# Patient Record
Sex: Male | Born: 1955
Health system: Southern US, Community
[De-identification: ages and names within clinical notes are randomized; demographics above are authoritative.]

## PROBLEM LIST (undated history)

## (undated) DIAGNOSIS — T7840XA Allergy, unspecified, initial encounter: Secondary | ICD-10-CM

## (undated) DIAGNOSIS — C801 Malignant (primary) neoplasm, unspecified: Secondary | ICD-10-CM

## (undated) DIAGNOSIS — E785 Hyperlipidemia, unspecified: Secondary | ICD-10-CM

## (undated) DIAGNOSIS — K219 Gastro-esophageal reflux disease without esophagitis: Secondary | ICD-10-CM

## (undated) DIAGNOSIS — D126 Benign neoplasm of colon, unspecified: Secondary | ICD-10-CM

## (undated) DIAGNOSIS — E559 Vitamin D deficiency, unspecified: Secondary | ICD-10-CM

## (undated) DIAGNOSIS — K648 Other hemorrhoids: Secondary | ICD-10-CM

## (undated) DIAGNOSIS — K5730 Diverticulosis of large intestine without perforation or abscess without bleeding: Secondary | ICD-10-CM

## (undated) DIAGNOSIS — I10 Essential (primary) hypertension: Secondary | ICD-10-CM

## (undated) DIAGNOSIS — H9190 Unspecified hearing loss, unspecified ear: Secondary | ICD-10-CM

## (undated) DIAGNOSIS — R011 Cardiac murmur, unspecified: Secondary | ICD-10-CM

## (undated) HISTORY — PX: OTHER SURGICAL HISTORY: SHX169

## (undated) HISTORY — DX: Unspecified hearing loss, unspecified ear: H91.90

## (undated) HISTORY — DX: Other hemorrhoids: K64.8

## (undated) HISTORY — DX: Gastro-esophageal reflux disease without esophagitis: K21.9

## (undated) HISTORY — DX: Diverticulosis of large intestine without perforation or abscess without bleeding: K57.30

## (undated) HISTORY — DX: Malignant (primary) neoplasm, unspecified: C80.1

## (undated) HISTORY — DX: Hyperlipidemia, unspecified: E78.5

## (undated) HISTORY — DX: Allergy, unspecified, initial encounter: T78.40XA

## (undated) HISTORY — PX: POLYPECTOMY: SHX149

## (undated) HISTORY — DX: Essential (primary) hypertension: I10

## (undated) HISTORY — DX: Cardiac murmur, unspecified: R01.1

## (undated) HISTORY — DX: Benign neoplasm of colon, unspecified: D12.6

## (undated) HISTORY — DX: Vitamin D deficiency, unspecified: E55.9

## (undated) HISTORY — PX: COLONOSCOPY: SHX174

---

## 2002-08-14 ENCOUNTER — Encounter (INDEPENDENT_AMBULATORY_CARE_PROVIDER_SITE_OTHER): Payer: Self-pay | Admitting: Specialist

## 2002-08-14 ENCOUNTER — Ambulatory Visit (HOSPITAL_BASED_OUTPATIENT_CLINIC_OR_DEPARTMENT_OTHER): Admission: RE | Admit: 2002-08-14 | Discharge: 2002-08-14 | Payer: Self-pay | Admitting: Surgery

## 2005-06-12 ENCOUNTER — Ambulatory Visit: Payer: Self-pay | Admitting: Internal Medicine

## 2005-06-26 ENCOUNTER — Ambulatory Visit: Payer: Self-pay | Admitting: Internal Medicine

## 2007-09-20 ENCOUNTER — Emergency Department (HOSPITAL_COMMUNITY): Admission: EM | Admit: 2007-09-20 | Discharge: 2007-09-20 | Payer: Self-pay | Admitting: Emergency Medicine

## 2008-09-11 ENCOUNTER — Encounter: Payer: Self-pay | Admitting: Internal Medicine

## 2008-10-20 ENCOUNTER — Ambulatory Visit: Payer: Self-pay | Admitting: Internal Medicine

## 2008-10-20 DIAGNOSIS — R195 Other fecal abnormalities: Secondary | ICD-10-CM | POA: Insufficient documentation

## 2008-11-12 ENCOUNTER — Encounter: Payer: Self-pay | Admitting: Internal Medicine

## 2008-11-12 ENCOUNTER — Ambulatory Visit: Payer: Self-pay | Admitting: Internal Medicine

## 2008-11-12 DIAGNOSIS — K5730 Diverticulosis of large intestine without perforation or abscess without bleeding: Secondary | ICD-10-CM

## 2008-11-12 DIAGNOSIS — D126 Benign neoplasm of colon, unspecified: Secondary | ICD-10-CM

## 2008-11-12 DIAGNOSIS — K648 Other hemorrhoids: Secondary | ICD-10-CM

## 2008-11-12 HISTORY — DX: Benign neoplasm of colon, unspecified: D12.6

## 2008-11-12 HISTORY — DX: Other hemorrhoids: K64.8

## 2008-11-12 HISTORY — DX: Diverticulosis of large intestine without perforation or abscess without bleeding: K57.30

## 2008-11-22 ENCOUNTER — Encounter: Payer: Self-pay | Admitting: Internal Medicine

## 2010-07-19 DIAGNOSIS — E785 Hyperlipidemia, unspecified: Secondary | ICD-10-CM

## 2010-07-19 DIAGNOSIS — I10 Essential (primary) hypertension: Secondary | ICD-10-CM | POA: Insufficient documentation

## 2010-09-09 NOTE — Op Note (Signed)
NAME:  Cesar West, Cesar West                            ACCOUNT NO.:  [id number]   MEDICAL RECORD NO.:  [id number]                   PATIENT TYPE:  AMB   LOCATION:  DSC                                  FACILITY:  [facility]   PHYSICIAN:  Abigail Miyamoto, M.D.              DATE OF BIRTH:  10/18/1955   DATE OF PROCEDURE:  08/14/2002  DATE OF DISCHARGE:                                 OPERATIVE REPORT   PREOPERATIVE DIAGNOSES:  1. Right axillary mass.  2. Left flank mass x 2.   DESCRIPTION OF PROCEDURE:  1. Excision of right axillary mass.  2. Excision of left flank mass x 2.   SURGEON:  Abigail Miyamoto, M.D.   ANESTHESIA:  1% Lidocaine and monitored anesthesia care.   ESTIMATED BLOOD LOSS:  Minimal.   FINDINGS:  The patient was found to have a 5 cm right flank mass.  It  appeared to be consistent with a sebaceous cyst.  Both left flank masses  appeared to be consistent with lipomas.   DESCRIPTION OF PROCEDURE:  The patient was brought to the operating room and  identified.  He was placed supine on the operating room table and anesthesia  was induced.  His axilla was then prepped and draped as well as his left  flank in the usual sterile manner.  The skin overlying the large right  axillary mass was then anesthetized with 1% Lidocaine.  Incision was made in  the skin and taken down through mass which appeared to be consistent with  sebaceous cyst.  The sebaceous material had to be expressed from the cyst in  order to get it out through the small incision.  The entire cyst capsule was  then identified and excised with the electrocautery.  The wound was then  irrigated with normal saline.  The skin was then closed with interrupted 3-0  Vicryl sutures and Steri-Strips.  Next, the skin over both left flank masses  were anesthetized with 1% Lidocaine.  Incisions were made with the scalpel  and taken down to the masses which appeared consistent with well formed  lipomas.  Each was then  completely excised with electrocautery.  Again these  wounds were irrigated and then closed with 3-0 Vicryl subcuticular sutures  and Steri-Strips.  Gauze and tape were then placed over all incisions.  The  patient tolerated the procedure well. All sponge, needle and instrument  counts were correct at the end of the procedure.  The patient was then taken  from the operating room to the recovery room.  The left flank masses were  sent to pathology for identification.                                               Abigail Miyamoto, M.D.  DB/MEDQ  D:  08/14/2002  T:  08/14/2002  Job:  [id number]

## 2010-10-27 ENCOUNTER — Ambulatory Visit
Admission: RE | Admit: 2010-10-27 | Discharge: 2010-10-27 | Disposition: A | Discharge status: Home / Self Care | Payer: Self-pay | Source: Ambulatory Visit | Attending: *Deleted | Admitting: *Deleted

## 2010-10-27 ENCOUNTER — Other Ambulatory Visit: Payer: Self-pay | Admitting: *Deleted

## 2010-10-27 ENCOUNTER — Other Ambulatory Visit: Payer: Self-pay | Admitting: Family Medicine

## 2010-10-27 DIAGNOSIS — T1490XA Injury, unspecified, initial encounter: Secondary | ICD-10-CM

## 2012-07-03 ENCOUNTER — Encounter: Payer: Self-pay | Admitting: Family Medicine

## 2012-07-03 DIAGNOSIS — Z860100 Personal history of colon polyps, unspecified: Secondary | ICD-10-CM | POA: Insufficient documentation

## 2012-07-03 DIAGNOSIS — H9193 Unspecified hearing loss, bilateral: Secondary | ICD-10-CM

## 2012-07-03 DIAGNOSIS — E559 Vitamin D deficiency, unspecified: Secondary | ICD-10-CM

## 2012-07-03 DIAGNOSIS — H9190 Unspecified hearing loss, unspecified ear: Secondary | ICD-10-CM | POA: Insufficient documentation

## 2012-07-12 ENCOUNTER — Encounter: Payer: Self-pay | Admitting: Family Medicine

## 2012-07-12 ENCOUNTER — Ambulatory Visit (INDEPENDENT_AMBULATORY_CARE_PROVIDER_SITE_OTHER): Payer: 59 | Admitting: Family Medicine

## 2012-07-12 VITALS — BP 140/83 | HR 66 | Temp 98.4°F | Ht 68.0 in | Wt 168.6 lb

## 2012-07-12 DIAGNOSIS — H9190 Unspecified hearing loss, unspecified ear: Secondary | ICD-10-CM

## 2012-07-12 DIAGNOSIS — I10 Essential (primary) hypertension: Secondary | ICD-10-CM

## 2012-07-12 DIAGNOSIS — E559 Vitamin D deficiency, unspecified: Secondary | ICD-10-CM

## 2012-07-12 DIAGNOSIS — E785 Hyperlipidemia, unspecified: Secondary | ICD-10-CM

## 2012-07-12 LAB — COMPLETE METABOLIC PANEL WITH GFR
ALT: 27 U/L (ref 0–53)
AST: 19 U/L (ref 0–37)
Albumin: 4.4 g/dL (ref 3.5–5.2)
Alkaline Phosphatase: 73 U/L (ref 39–117)
BUN: 11 mg/dL (ref 6–23)
CO2: 28 mEq/L (ref 19–32)
Calcium: 9.1 mg/dL (ref 8.4–10.5)
Chloride: 104 mEq/L (ref 96–112)
Creat: 0.93 mg/dL (ref 0.50–1.35)
GFR, Est African American: >89 mL/min
GFR, Est Non African American: >89 mL/min
Glucose, Bld: 106 mg/dL — ABNORMAL HIGH (ref 70–99)
Potassium: 4.4 mEq/L (ref 3.5–5.3)
Sodium: 138 mEq/L (ref 135–145)
Total Bilirubin: 1.5 mg/dL — ABNORMAL HIGH (ref 0.3–1.2)
Total Protein: 7 g/dL (ref 6.0–8.3)

## 2012-07-12 MED ORDER — AMLODIPINE BESYLATE 5 MG PO TABS: 10.0000 mg | ORAL_TABLET | Freq: Every day | ORAL | Status: DC

## 2012-07-12 MED ORDER — LOSARTAN POTASSIUM 50 MG PO TABS: 50.0000 mg | ORAL_TABLET | Freq: Every day | ORAL | Status: DC

## 2012-07-12 NOTE — Progress Notes (Signed)
Subjective:     Patient ID: Cesar West, male   DOB: 18-Aug-1955, 57 y.o.   MRN: [id number]  HPI  Cesar West is here for followup of his medical problems:  1 hypertension: No headache chest pain palpitations or pedal edema he is doing quite well with present regimen which includes Norvasc and losartan. 2 hyperlipidemia: He stopped his statin he felt too tired. He does not want to go back on medication. 3 vitamin D deficiency he supposed to be taking vitamin D replacement therapy. Past Medical History  Diagnosis Date  . Adenomatous colon polyp 7 /22/ 2010  . Hypertension   . Hyperlipidemia   . Hearing loss   . Vitamin D deficiency   . Diverticulosis of colon 11/12/2008    left colon  . Internal hemorrhoid 11/12/2008  . Cancer     basal cell skin of the right triceps area   History reviewed. No pertinent past surgical history. History   Social History  . Marital Status: Single    Spouse Name: N/A    Number of Children: N/A  . Years of Education: N/A   Occupational History  . Not on file.   Social History Main Topics  . Smoking status: Never Smoker   . Smokeless tobacco: Not on file  . Alcohol Use: No  . Drug Use: No  . Sexually Active: Not on file   Other Topics Concern  . Not on file   Social History Narrative  . No narrative on file   Family History  Problem Relation Age of Onset  . Hypertension Mother   . Hypertension Father   . Heart disease Father    Current Outpatient Prescriptions on File Prior to Visit  Medication Sig Dispense Refill  . cholecalciferol (VITAMIN D) 1000 UNITS tablet Take 2,000 Units by mouth daily.      . fish oil-omega-3 fatty acids 1000 MG capsule Take 2 g by mouth daily.      Marland Kitchen atorvastatin (LIPITOR) 40 MG tablet Take 40 mg by mouth daily.        . Ezetimibe-Atorvastatin (LIPTRUZET) 10-10 MG TABS Take 1 tablet by mouth daily.       No current facility-administered medications on file prior to visit.   No Known Allergies Immunization  History  Administered Date(s) Administered  . Influenza Whole 04/04/2012  . Td 04/09/2007, 03/06/2011   Prior to Admission medications   Medication Sig Start Date End Date Taking? Authorizing Provider  amLODipine (NORVASC) 5 MG tablet Take 2 tablets (10 mg total) by mouth daily. 07/12/12  Yes Ileana Ladd, MD  cholecalciferol (VITAMIN D) 1000 UNITS tablet Take 2,000 Units by mouth daily.   Yes Historical Provider, MD  fish oil-omega-3 fatty acids 1000 MG capsule Take 2 g by mouth daily.   Yes Historical Provider, MD  losartan (COZAAR) 50 MG tablet Take 1 tablet (50 mg total) by mouth daily. 07/12/12  Yes Ileana Ladd, MD  atorvastatin (LIPITOR) 40 MG tablet Take 40 mg by mouth daily.      Historical Provider, MD  Ezetimibe-Atorvastatin (LIPTRUZET) 10-10 MG TABS Take 1 tablet by mouth daily.    Historical Provider, MD    Review of Systems All of the systems are negative. His fatigue resolved when he stopped the statin.    Objective:   Physical Exam On examination he appeared in no acute distress. Vital signs as documented. BP 140/83  Pulse 66  Temp(Src) 98.4 F (36.9 C) (Oral)  Ht 5\' 8"  (1.727  m)  Wt 168 lb 9.6 oz (76.476 kg)  BMI 25.64 kg/m2  Skin warm and dry and without overt rashes.  Head &Neck without JVD. Normal. Bilateral hearing loss Lungs clear.  Heart exam notable for regular rhythm, normal sounds and absence of murmurs, rubs or gallops.  Abdomen unremarkable and without evidence of organomegaly, masses, or abdominal aortic enlargement. Mild central obesity Extremities nonedematous.    Assessment:     Unspecified vitamin D deficiency - Plan: Vitamin D 25 hydroxy  Hearing loss, unspecified laterality  HTN (hypertension) - Plan: COMPLETE METABOLIC PANEL WITH GFR  Hyperlipidemia - Plan: COMPLETE METABOLIC PANEL WITH GFR, NMR Lipoprofile with Lipids      We discussed about getting hearing aids he is reluctant to do that we discussed his risk for coronary event  with hyperlipidemia and his risk factors. Has tried a vegan diet diet not 100%. He is doing his best to make lifestyle therapeutic changes quite a challenge for him. He is reluctant to be on medicines.  Plan:     Orders Placed This Encounter  Procedures  . COMPLETE METABOLIC PANEL WITH GFR  . NMR Lipoprofile with Lipids  . Vitamin D 25 hydroxy   Diet and Exercise discussed with patient. For nutrition information, I recommended books: Eat to Live by Dr Monico Hoar. Prevent and Reverse Heart Disease by Dr Suzzette Righter.  Exercise recommendations are:  If unable to walk, then the patient can exercise in a chair 3 times a day. By flapping arms like a bird gently and raising legs outwards to the front.  If ambulatory, the patient can go for walks for 30 minutes 3 times a week. Then increase the intensity and duration as tolerated. Goal is to try to attain exercise frequency to 5 times a week. Best to perform resistance exercises 2 days a week and cardio type exercises 3 days per week.  Discussed at length with him the need for a statin to reduce his cardiovascular event risk. He will reconsider this based on the lab tests drawn today. Discussed LTC lifestyle therapeutic changes.  Terrisa Curfman P. Modesto Charon, M.D.

## 2012-07-13 LAB — VITAMIN D 25 HYDROXY (VIT D DEFICIENCY, FRACTURES): Vit D, 25-Hydroxy: 50 ng/mL (ref 30–89)

## 2012-07-17 LAB — NMR LIPOPROFILE WITH LIPIDS
Cholesterol, Total: 172 mg/dL (ref <200)
HDL Particle Number: 27.4 umol/L — ABNORMAL LOW (ref >=30.5)
HDL Size: 8.4 nm — ABNORMAL LOW (ref >=9.2)
HDL-C: 38 mg/dL — ABNORMAL LOW (ref >=40)
LDL (calc): 118 mg/dL — ABNORMAL HIGH (ref <100)
LDL Particle Number: 1389 nmol/L — ABNORMAL HIGH (ref <1000)
LDL Size: 20.8 nm (ref >20.5)
LP-IR Score: 43 (ref <=45)
Large HDL-P: 1.4 umol/L — ABNORMAL LOW (ref >=4.8)
Large VLDL-P: <0.8 nmol/L (ref <=2.7)
Small LDL Particle Number: 331 nmol/L (ref <=527)
Triglycerides: 80 mg/dL (ref <150)
VLDL Size: 40.7 nm (ref >=46.6)

## 2012-07-20 NOTE — Progress Notes (Signed)
Quick Note:  Labs abnormal. The LDLp is high and the LDLc is a little high. He really should be on some statin.I think he should try some Coenzyme Q10 daily to offset any fatigue with a Statin and start on Pravastatin 20 mg daily #30 RF x 3. If he okays then call in Rx. Thanks. ______

## 2012-07-22 ENCOUNTER — Telehealth: Payer: Self-pay | Admitting: Family Medicine

## 2012-07-22 NOTE — Telephone Encounter (Signed)
Notified pt of lab results 

## 2012-08-02 ENCOUNTER — Encounter: Payer: Self-pay | Admitting: Family Medicine

## 2013-01-05 ENCOUNTER — Other Ambulatory Visit: Payer: Self-pay | Admitting: Family Medicine

## 2013-02-03 ENCOUNTER — Other Ambulatory Visit: Payer: Self-pay | Admitting: Family Medicine

## 2013-03-28 ENCOUNTER — Ambulatory Visit (INDEPENDENT_AMBULATORY_CARE_PROVIDER_SITE_OTHER): Payer: Managed Care, Other (non HMO) | Admitting: Family Medicine

## 2013-03-28 ENCOUNTER — Encounter: Payer: Self-pay | Admitting: Family Medicine

## 2013-03-28 VITALS — BP 148/81 | HR 56 | Temp 98.0°F | Ht 68.0 in | Wt 159.2 lb

## 2013-03-28 DIAGNOSIS — Z860100 Personal history of colon polyps, unspecified: Secondary | ICD-10-CM

## 2013-03-28 DIAGNOSIS — Z119 Encounter for screening for infectious and parasitic diseases, unspecified: Secondary | ICD-10-CM

## 2013-03-28 DIAGNOSIS — E559 Vitamin D deficiency, unspecified: Secondary | ICD-10-CM

## 2013-03-28 DIAGNOSIS — I10 Essential (primary) hypertension: Secondary | ICD-10-CM

## 2013-03-28 DIAGNOSIS — H9190 Unspecified hearing loss, unspecified ear: Secondary | ICD-10-CM

## 2013-03-28 DIAGNOSIS — E785 Hyperlipidemia, unspecified: Secondary | ICD-10-CM

## 2013-03-28 DIAGNOSIS — Z0000 Encounter for general adult medical examination without abnormal findings: Secondary | ICD-10-CM

## 2013-03-28 DIAGNOSIS — Z125 Encounter for screening for malignant neoplasm of prostate: Secondary | ICD-10-CM

## 2013-03-28 MED ORDER — AMLODIPINE BESYLATE 10 MG PO TABS
10.0000 mg | ORAL_TABLET | Freq: Every day | ORAL | Status: DC
Start: 2013-03-28 — End: 2014-03-30

## 2013-03-28 MED ORDER — LOSARTAN POTASSIUM 100 MG PO TABS: ORAL_TABLET | ORAL | Status: DC

## 2013-03-28 NOTE — Patient Instructions (Addendum)
HEALTH MAINTENANCE Immunizations: Tetanus-Diphtheria Booster [phone] Pertusis Booster [phone] Flu Shot Due: every Fall Pneumonia Vaccine: usually at 59 years of age unless there are certain risk situations. Herpes Zoster/Shingles Vaccine due: usually at 59 years of age HPV AOZ:HYQM age 88 to 21 years in males and females.  Healthy Life Habits: Exercise Goal: 5-6 days/week; start gradually(ie 30 minutes/3days per week) Nutrition: Balanced healthy meals including Vegetables and Fruits. Consider  Reading the following books: 1) Eat to Live by Dr Ottis Stain; 2) Prevent and Reverse Heart Disease by Dr Suzzette Righter.  Vitamins:okay a multivitamin Aspirin:81 mg Stop Tobacco Use:n/a Seat Belt Use:+++ recommended Sunscreen Use:+++ recommended     Recommended Screening Tests: Colon Cancer Screening:due 2015 Blood work: today Cholesterol Screening: today           HIV:   n/a                Hepatitis C(people born 1945-1965):today   Monthly Self Testicular Exam:++++  Eye Exam: every 1 to 2 years recommended Dental Health: at least every 6 months  Others:    Living Will/Healthcare Power of Attorney: should have this in order with your personal estate planning

## 2013-03-28 NOTE — Progress Notes (Signed)
Patient ID: Xzavion Doswell, male   DOB: 1955-12-24, 57 y.o.   MRN: [id number] SUBJECTIVE: CC: Chief Complaint  Patient presents with  . Annual Exam    here for cpx    HPI:   Annual physical.  No complaints.   Patient is here for follow up of hyperlipidemia/HTN/vit d def: denies Headache;denies Chest Pain;denies weakness;denies Shortness of Breath and orthopnea;denies Visual changes;denies palpitations;denies cough;denies pedal edema;denies symptoms of TIA or stroke;deniesClaudication symptoms. admits to Compliance with medications; denies Problems with medications.   Plans to get hearing aids.  Past Medical History  Diagnosis Date  . Adenomatous colon polyp 7 /22/ 2010  . Hypertension   . Hyperlipidemia   . Hearing loss   . Vitamin D deficiency   . Diverticulosis of colon 11/12/2008    left colon  . Internal hemorrhoid 11/12/2008  . Cancer     basal cell skin of the right triceps area   No past surgical history on file. History   Social History  . Marital Status: Single    Spouse Name: N/A    Number of Children: N/A  . Years of Education: N/A   Occupational History  . Not on file.   Social History Main Topics  . Smoking status: Never Smoker   . Smokeless tobacco: Not on file  . Alcohol Use: No  . Drug Use: No  . Sexual Activity: Not on file   Other Topics Concern  . Not on file   Social History Narrative  . No narrative on file   Family History  Problem Relation Age of Onset  . Hypertension Mother   . Hypertension Father   . Heart disease Father    Current Outpatient Prescriptions on File Prior to Visit  Medication Sig Dispense Refill  . cholecalciferol (VITAMIN D) 1000 UNITS tablet Take 2,000 Units by mouth daily.      . fish oil-omega-3 fatty acids 1000 MG capsule Take 2 g by mouth daily.       No current facility-administered medications on file prior to visit.   No Known Allergies Immunization History  Administered Date(s) Administered  .  Influenza Split 01/16/2013  . Influenza Whole 04/04/2012  . Td 04/09/2007, 03/06/2011   Prior to Admission medications   Medication Sig Start Date End Date Taking? Authorizing Provider  amLODipine (NORVASC) 5 MG tablet TAKE 2 TABLETS (10 MG TOTAL) BY MOUTH DAILY. 02/03/13   Ileana Ladd, MD  atorvastatin (LIPITOR) 40 MG tablet Take 40 mg by mouth daily.      Historical Provider, MD  cholecalciferol (VITAMIN D) 1000 UNITS tablet Take 2,000 Units by mouth daily.    Historical Provider, MD  Ezetimibe-Atorvastatin (LIPTRUZET) 10-10 MG TABS Take 1 tablet by mouth daily.    Historical Provider, MD  fish oil-omega-3 fatty acids 1000 MG capsule Take 2 g by mouth daily.    Historical Provider, MD  losartan (COZAAR) 50 MG tablet TAKE 1 TABLET (50 MG TOTAL) BY MOUTH DAILY. 02/03/13   Ileana Ladd, MD     ROS: As above in the HPI. All other systems are stable or negative.  OBJECTIVE: APPEARANCE:  Patient in no acute distress.The patient appeared well nourished and normally developed. Acyanotic. Waist: VITAL SIGNS:BP 148/81  Pulse 56  Temp(Src) 98 F (36.7 C) (Oral)  Ht 5\' 8"  (1.727 m)  Wt 159 lb 3.2 oz (72.213 kg)  BMI 24.21 kg/m2   SKIN: warm and  Dry without overt rashes, tattoos and scars  HEAD and Neck:  without JVD, Head and scalp: normal Eyes:No scleral icterus. Fundi normal, eye movements normal. Ears: Auricle normal, canal normal, Tympanic membranes normal, insufflation normal. Nose: normal Throat: normal Neck & thyroid: normal  CHEST & LUNGS: Chest wall: normal Lungs: Clear  CVS: Reveals the PMI to be normally located. Regular rhythm, First and Second Heart sounds are normal,  absence of murmurs, rubs or gallops. Peripheral vasculature: Radial pulses: normal Dorsal pedis pulses: normal Posterior pulses: normal  ABDOMEN:  Appearance: normal Benign, no organomegaly, no masses, no Abdominal Aortic enlargement. No Guarding , no rebound. No Bruits. Bowel sounds:  normal  RECTAL: N/A GU: N/A  EXTREMETIES: nonedematous.  MUSCULOSKELETAL:  Spine: normal Joints: intact  NEUROLOGIC: oriented to time,place and person; nonfocal. Strength is normal Sensory is normal Reflexes are normal Cranial Nerves are normal.  ASSESSMENT:  Annual physical exam - Plan: PSA, total and free, Hepatitis C antibody  Personal history of colonic polyps  Hearing loss, unspecified laterality  Unspecified vitamin D deficiency - Plan: Vit D  25 hydroxy (rtn osteoporosis monitoring)  HTN (hypertension) - Plan: CMP14+EGFR, losartan (COZAAR) 100 MG tablet, amLODipine (NORVASC) 10 MG tablet  Hyperlipidemia - Plan: CMP14+EGFR, NMR, lipoprofile  Screening examination for infectious disease - Plan: Hepatitis C antibody  Screening for prostate cancer - Plan: PSA, total and free  BP not at goal. Will adjust medications.  PLAN:      HEALTH MAINTENANCE Immunizations: Tetanus-Diphtheria Booster [phone] Pertusis Booster [phone] Flu Shot Due: every Fall Pneumonia Vaccine: usually at 59 years of age unless there are certain risk situations. Herpes Zoster/Shingles Vaccine due: usually at 59 years of age HPV XBJ:YNWG age 39 to 8 years in males and females.  Healthy Life Habits: Exercise Goal: 5-6 days/week; start gradually(ie 30 minutes/3days per week) Nutrition: Balanced healthy meals including Vegetables and Fruits. Consider  Reading the following books: 1) Eat to Live by Dr Ottis Stain; 2) Prevent and Reverse Heart Disease by Dr Suzzette Righter.  Vitamins:okay a multivitamin Aspirin:81 mg Stop Tobacco Use:n/a Seat Belt Use:+++ recommended Sunscreen Use:+++ recommended     Recommended Screening Tests: Colon Cancer Screening:due 2015 Blood work: today Cholesterol Screening: today           HIV:   n/a                Hepatitis C(people born 1945-1965):today   Monthly Self Testicular Exam:++++  Eye Exam: every 1 to 2 years recommended Dental Health: at  least every 6 months  Others:    Living Will/Healthcare Power of Attorney: should have this in order with your personal estate planning   Orders Placed This Encounter  Procedures  . CMP14+EGFR  . NMR, lipoprofile  . PSA, total and free  . Hepatitis C antibody  . Vit D  25 hydroxy (rtn osteoporosis monitoring)   Meds ordered this encounter  Medications  . losartan (COZAAR) 100 MG tablet    Sig: TAKE 1 TABLET (50 MG TOTAL) BY MOUTH DAILY.    Dispense:  30 tablet    Refill:  5  . amLODipine (NORVASC) 10 MG tablet    Sig: Take 1 tablet (10 mg total) by mouth daily.    Dispense:  30 tablet    Refill:  11   Medications Discontinued During This Encounter  Medication Reason  . atorvastatin (LIPITOR) 40 MG tablet Patient has not taken in last 30 days  . Ezetimibe-Atorvastatin (LIPTRUZET) 10-10 MG TABS Patient has not taken in last 30 days  . losartan (COZAAR) 50 MG  tablet Reorder  . amLODipine (NORVASC) 5 MG tablet Reorder   Return in about 3 months (around 06/26/2013) for recheck BP, Recheck medical problems.  Trevia Nop P. [place] Charon, M.D.

## 2013-03-29 LAB — CMP14+EGFR
ALT: 21 IU/L (ref 0–44)
AST: 19 IU/L (ref 0–40)
Albumin/Globulin Ratio: 1.7 (ref 1.1–2.5)
Albumin: 4.7 g/dL (ref 3.5–5.5)
Alkaline Phosphatase: 85 IU/L (ref 39–117)
BUN/Creatinine Ratio: 11 (ref 9–20)
BUN: 11 mg/dL (ref 6–24)
CO2: 29 mmol/L (ref 18–29)
Calcium: 10.1 mg/dL (ref 8.7–10.2)
Chloride: 100 mmol/L (ref 97–108)
Creatinine, Ser: 1.01 mg/dL (ref 0.76–1.27)
GFR calc Af Amer: 94 mL/min/{1.73_m2} (ref >59)
GFR calc non Af Amer: 81 mL/min/{1.73_m2} (ref >59)
Globulin, Total: 2.7 g/dL (ref 1.5–4.5)
Glucose: 99 mg/dL (ref 65–99)
Potassium: 4.7 mmol/L (ref 3.5–5.2)
Sodium: 142 mmol/L (ref 134–144)
Total Bilirubin: 1.6 mg/dL — ABNORMAL HIGH (ref 0.0–1.2)
Total Protein: 7.4 g/dL (ref 6.0–8.5)

## 2013-03-29 LAB — PSA, TOTAL AND FREE
PSA, Free Pct: 29.3 %
PSA, Free: 0.41 ng/mL
PSA: 1.4 ng/mL (ref 0.0–4.0)

## 2013-03-29 LAB — NMR, LIPOPROFILE
Cholesterol: 170 mg/dL (ref <200)
HDL Cholesterol by NMR: 48 mg/dL (ref >=40)
HDL Particle Number: 35 umol/L (ref >=30.5)
LDL Particle Number: 1473 nmol/L — ABNORMAL HIGH (ref <1000)
LDL Size: 20.6 nm (ref >20.5)
LDLC SERPL CALC-MCNC: 102 mg/dL — ABNORMAL HIGH (ref <100)
LP-IR Score: 48 — ABNORMAL HIGH (ref <=45)
Small LDL Particle Number: 803 nmol/L — ABNORMAL HIGH (ref <=527)
Triglycerides by NMR: 101 mg/dL (ref <150)

## 2013-03-29 LAB — HEPATITIS C ANTIBODY: Hep C Virus Ab: <0.1 s/co ratio (ref 0.0–0.9)

## 2013-03-29 LAB — VITAMIN D 25 HYDROXY (VIT D DEFICIENCY, FRACTURES): Vit D, 25-Hydroxy: 44.1 ng/mL (ref 30.0–100.0)

## 2013-06-02 ENCOUNTER — Encounter: Payer: Self-pay | Admitting: *Deleted

## 2013-06-26 ENCOUNTER — Ambulatory Visit: Payer: Managed Care, Other (non HMO) | Admitting: Family Medicine

## 2013-06-27 ENCOUNTER — Ambulatory Visit: Payer: Managed Care, Other (non HMO) | Admitting: Family Medicine

## 2013-07-11 ENCOUNTER — Encounter: Payer: Self-pay | Admitting: Family Medicine

## 2013-07-11 ENCOUNTER — Ambulatory Visit (INDEPENDENT_AMBULATORY_CARE_PROVIDER_SITE_OTHER): Payer: Managed Care, Other (non HMO) | Admitting: Family Medicine

## 2013-07-11 VITALS — BP 143/81 | HR 62 | Temp 97.9°F | Ht 68.0 in | Wt 165.4 lb

## 2013-07-11 DIAGNOSIS — E559 Vitamin D deficiency, unspecified: Secondary | ICD-10-CM

## 2013-07-11 DIAGNOSIS — I10 Essential (primary) hypertension: Secondary | ICD-10-CM

## 2013-07-11 DIAGNOSIS — Z860100 Personal history of colon polyps, unspecified: Secondary | ICD-10-CM

## 2013-07-11 DIAGNOSIS — H9190 Unspecified hearing loss, unspecified ear: Secondary | ICD-10-CM

## 2013-07-11 DIAGNOSIS — E785 Hyperlipidemia, unspecified: Secondary | ICD-10-CM

## 2013-07-11 MED ORDER — LOSARTAN POTASSIUM-HCTZ 100-25 MG PO TABS: 1.0000 | ORAL_TABLET | Freq: Every day | ORAL | Status: DC

## 2013-07-11 NOTE — Patient Instructions (Signed)
DASH Diet The DASH diet stands for "Dietary Approaches to Stop Hypertension." It is a healthy eating plan that has been shown to reduce high blood pressure (hypertension) in as little as 14 days, while also possibly providing other significant health benefits. These other health benefits include reducing the risk of breast cancer after menopause and reducing the risk of type 2 diabetes, heart disease, colon cancer, and stroke. Health benefits also include weight loss and slowing kidney failure in patients with chronic kidney disease.  DIET GUIDELINES  Limit salt (sodium). Your diet should contain less than 1500 mg of sodium daily.  Limit refined or processed carbohydrates. Your diet should include mostly whole grains. Desserts and added sugars should be used sparingly.  Include small amounts of heart-healthy fats. These types of fats include nuts, oils, and tub margarine. Limit saturated and trans fats. These fats have been shown to be harmful in the body. CHOOSING FOODS  The following food groups are based on a 2000 calorie diet. See your Registered Dietitian for individual calorie needs. Grains and Grain Products (6 to 8 servings daily)  Eat More Often: Whole-wheat bread, brown rice, whole-grain or wheat pasta, quinoa, popcorn without added fat or salt (air popped).  Eat Less Often: White bread, white pasta, white rice, cornbread. Vegetables (4 to 5 servings daily)  Eat More Often: Fresh, frozen, and canned vegetables. Vegetables may be raw, steamed, roasted, or grilled with a minimal amount of fat.  Eat Less Often/Avoid: Creamed or fried vegetables. Vegetables in a cheese sauce. Fruit (4 to 5 servings daily)  Eat More Often: All fresh, canned (in natural juice), or frozen fruits. Dried fruits without added sugar. One hundred percent fruit juice ( cup [237 mL] daily).  Eat Less Often: Dried fruits with added sugar. Canned fruit in light or heavy syrup. [employer], Fish, and Poultry (2  servings or less daily. One serving is 3 to 4 oz [85-114 g]).  Eat More Often: Ninety percent or leaner ground beef, tenderloin, sirloin. Round cuts of beef, chicken breast, [place] breast. All fish. Grill, bake, or broil your meat. Nothing should be fried.  Eat Less Often/Avoid: Fatty cuts of meat, [place], or chicken leg, thigh, or wing. Fried cuts of meat or fish. Dairy (2 to 3 servings)  Eat More Often: Low-fat or fat-free milk, low-fat plain or light yogurt, reduced-fat or part-skim cheese.  Eat Less Often/Avoid: Milk (whole, 2%).Whole milk yogurt. Full-fat cheeses. Nuts, Seeds, and Legumes (4 to 5 servings per week)  Eat More Often: All without added salt.  Eat Less Often/Avoid: Salted nuts and seeds, canned beans with added salt. Fats and Sweets (limited)  Eat More Often: Vegetable oils, tub margarines without trans fats, sugar-free gelatin. Mayonnaise and salad dressings.  Eat Less Often/Avoid: Coconut oils, palm oils, butter, stick margarine, cream, half and half, cookies, candy, pie. FOR MORE INFORMATION The Dash Diet Eating Plan: www.dashdiet.org Document Released: 03/30/2011 Document Revised: 07/03/2011 Document Reviewed: 03/30/2011 [facility] Patient Information 2014 [place], [place].        Dr Paula Libra Recommendations  For nutrition information, I recommend books:  1).Eat to Live by Dr Excell Seltzer. 2).Prevent and Reverse Heart Disease by Dr Karl Luke. 3) Dr Janene Harvey Book:  Program to Reverse Diabetes  Exercise recommendations are:  If unable to walk, then the patient can exercise in a chair 3 times a day. By flapping arms like a bird gently and raising legs outwards to the front.  If ambulatory, the patient can go for  walks for 30 minutes 3 times a week. Then increase the intensity and duration as tolerated.  Goal is to try to attain exercise frequency to 5 times a week.  If applicable: Best to perform resistance exercises (machines or  weights) 2 days a week and cardio type exercises 3 days per week.

## 2013-07-11 NOTE — Progress Notes (Signed)
Patient ID: Cesar West, male   DOB: Jul 14, 1955, 58 y.o.   MRN: [id number] SUBJECTIVE: CC: Chief Complaint  Patient presents with  . Follow-up    3 month follow up c/o bloatness     HPI: Patient is here for follow up of hyperlipidemia/HTN/vit d def: denies Headache;denies Chest Pain;denies weakness;denies Shortness of Breath and orthopnea;denies Visual changes;denies palpitations;denies cough;denies pedal edema;denies symptoms of TIA or stroke;deniesClaudication symptoms. admits to Compliance with medications; denies Problems with medications.  Has some gaseousness with the medications. No pain no fever. Past Medical History  Diagnosis Date  . Adenomatous colon polyp 7 /22/ 2010  . Hypertension   . Hyperlipidemia   . Hearing loss   . Vitamin D deficiency   . Diverticulosis of colon 11/12/2008    left colon  . Internal hemorrhoid 11/12/2008  . Cancer     basal cell skin of the right triceps area   No past surgical history on file. History   Social History  . Marital Status: Single    Spouse Name: N/A    Number of Children: N/A  . Years of Education: N/A   Occupational History  . Not on file.   Social History Main Topics  . Smoking status: Never Smoker   . Smokeless tobacco: Not on file  . Alcohol Use: No  . Drug Use: No  . Sexual Activity: Not on file   Other Topics Concern  . Not on file   Social History Narrative  . No narrative on file   Family History  Problem Relation Age of Onset  . Hypertension Mother   . Hypertension Father   . Heart disease Father    Current Outpatient Prescriptions on File Prior to Visit  Medication Sig Dispense Refill  . amLODipine (NORVASC) 10 MG tablet Take 1 tablet (10 mg total) by mouth daily.  30 tablet  11  . cholecalciferol (VITAMIN D) 1000 UNITS tablet Take 2,000 Units by mouth daily.      . fish oil-omega-3 fatty acids 1000 MG capsule Take 2 g by mouth daily.       No current facility-administered medications on file  prior to visit.   No Known Allergies Immunization History  Administered Date(s) Administered  . Influenza Split 01/16/2013  . Influenza Whole 04/04/2012  . Td 04/09/2007, 03/06/2011   Prior to Admission medications   Medication Sig Start Date End Date Taking? Authorizing Provider  amLODipine (NORVASC) 10 MG tablet Take 1 tablet (10 mg total) by mouth daily. 03/28/13  Yes Vernie Shanks, MD  cholecalciferol (VITAMIN D) 1000 UNITS tablet Take 2,000 Units by mouth daily.   Yes Historical Provider, MD  fish oil-omega-3 fatty acids 1000 MG capsule Take 2 g by mouth daily.   Yes Historical Provider, MD  losartan (COZAAR) 100 MG tablet TAKE 1 TABLET (50 MG TOTAL) BY MOUTH DAILY. 03/28/13  Yes Vernie Shanks, MD     ROS: As above in the HPI. All other systems are stable or negative.  OBJECTIVE: APPEARANCE:  Patient in no acute distress.The patient appeared well nourished and normally developed. Acyanotic. Waist: VITAL SIGNS:BP 143/81  Pulse 62  Temp(Src) 97.9 F (36.6 C) (Oral)  Ht 5' 8"  (1.727 m)  Wt 165 lb 6.4 oz (75.025 kg)  BMI 25.15 kg/m2  WM  SKIN: warm and  Dry without overt rashes, tattoos and scars  HEAD and Neck: without JVD, Head and scalp: normal Eyes:No scleral icterus. Fundi normal, eye movements normal. Ears: Auricle normal, canal normal,  Tympanic membranes normal, insufflation normal. Nose: normal Throat: normal Neck & thyroid: normal  CHEST & LUNGS: Chest wall: normal Lungs: Clear  CVS: Reveals the PMI to be normally located. Regular rhythm, First and Second Heart sounds are normal,  absence of murmurs, rubs or gallops. Peripheral vasculature: Radial pulses: normal Dorsal pedis pulses: normal Posterior pulses: normal  ABDOMEN:  Appearance: normal Benign, no organomegaly, no masses, no Abdominal Aortic enlargement. No Guarding , no rebound. No Bruits. Bowel sounds: normal  RECTAL: N/A GU: N/A  EXTREMETIES: nonedematous.  MUSCULOSKELETAL:   Spine: normal Joints: intact  NEUROLOGIC: oriented to time,place and person; nonfocal. Strength is normal Sensory is normal Reflexes are normal Cranial Nerves are normal. Results for orders placed in visit on 03/28/13  CMP14+EGFR      Result Value Ref Range   Glucose 99  65 - 99 mg/dL   BUN 11  6 - 24 mg/dL   Creatinine, Ser 1.01  0.76 - 1.27 mg/dL   GFR calc non Af Amer 81  >59 mL/min/1.73   GFR calc Af Amer 94  >59 mL/min/1.73   BUN/Creatinine Ratio 11  9 - 20   Sodium 142  134 - 144 mmol/L   Potassium 4.7  3.5 - 5.2 mmol/L   Chloride 100  97 - 108 mmol/L   CO2 29  18 - 29 mmol/L   Calcium 10.1  8.7 - 10.2 mg/dL   Total Protein 7.4  6.0 - 8.5 g/dL   Albumin 4.7  3.5 - 5.5 g/dL   Globulin, Total 2.7  1.5 - 4.5 g/dL   Albumin/Globulin Ratio 1.7  1.1 - 2.5   Total Bilirubin 1.6 (*) 0.0 - 1.2 mg/dL   Alkaline Phosphatase 85  39 - 117 IU/L   AST 19  0 - 40 IU/L   ALT 21  0 - 44 IU/L  NMR, LIPOPROFILE      Result Value Ref Range   LDL Particle Number [id number] (*) <1000 nmol/L   LDLC SERPL CALC-MCNC 102 (*) <100 mg/dL   HDL Cholesterol by NMR 48  >=40 mg/dL   Triglycerides by NMR 101  <150 mg/dL   Cholesterol 170  <200 mg/dL   HDL Particle Number 35.0  >=30.5 umol/L   Small LDL Particle Number [id number] (*) <=527 nmol/L   LDL Size 20.6  >20.5 nm   LP-IR Score 48 (*) <=45  PSA, TOTAL AND FREE      Result Value Ref Range   PSA 1.4  0.0 - 4.0 ng/mL   PSA, Free 0.41  N/A ng/mL   PSA, Free Pct 29.3    HEPATITIS C ANTIBODY      Result Value Ref Range   Hep C Virus Ab <0.1  0.0 - 0.9 s/co ratio  VITAMIN D 25 HYDROXY      Result Value Ref Range   Vit D, 25-Hydroxy 44.1  30.0 - 100.0 ng/mL    ASSESSMENT:  Hyperlipidemia - Plan: CMP14+EGFR, NMR, lipoprofile  HTN (hypertension) - Plan: CMP14+EGFR, losartan-hydrochlorothiazide (HYZAAR) 100-25 MG per tablet  Unspecified vitamin D deficiency  Personal history of colonic polyps  Hearing loss BP not quite at goal. Will adjust his  BP medications.   PLAN:      Dr Paula Libra Recommendations  For nutrition information, I recommend books:  1).Eat to Live by Dr Excell Seltzer. 2).Prevent and Reverse Heart Disease by Dr Karl Luke. 3) Dr Janene Harvey Book:  Program to Reverse Diabetes  Exercise recommendations are:  If unable to  walk, then the patient can exercise in a chair 3 times a day. By flapping arms like a bird gently and raising legs outwards to the front.  If ambulatory, the patient can go for walks for 30 minutes 3 times a week. Then increase the intensity and duration as tolerated.  Goal is to try to attain exercise frequency to 5 times a week.  If applicable: Best to perform resistance exercises (machines or weights) 2 days a week and cardio type exercises 3 days per week.  Handout on DASH Diet.  Orders Placed This Encounter  Procedures  . CMP14+EGFR  . NMR, lipoprofile   Meds ordered this encounter  Medications  . aspirin EC 81 MG tablet    Sig: Take 81 mg by mouth daily.  Marland Kitchen losartan-hydrochlorothiazide (HYZAAR) 100-25 MG per tablet    Sig: Take 1 tablet by mouth daily.    Dispense:  30 tablet    Refill:  5   Medications Discontinued During This Encounter  Medication Reason  . losartan (COZAAR) 100 MG tablet Change in therapy   Return in about 4 weeks (around 08/08/2013) for recheck BP.  Teya Otterson P. Jacelyn Grip, M.D.

## 2013-07-13 LAB — CMP14+EGFR
ALT: 43 IU/L (ref 0–44)
AST: 26 IU/L (ref 0–40)
Albumin/Globulin Ratio: 2 (ref 1.1–2.5)
Albumin: 4.6 g/dL (ref 3.5–5.5)
Alkaline Phosphatase: 103 IU/L (ref 39–117)
BUN/Creatinine Ratio: 11 (ref 9–20)
BUN: 10 mg/dL (ref 6–24)
CO2: 25 mmol/L (ref 18–29)
Calcium: 9.3 mg/dL (ref 8.7–10.2)
Chloride: 103 mmol/L (ref 97–108)
Creatinine, Ser: 0.95 mg/dL (ref 0.76–1.27)
GFR calc Af Amer: 101 mL/min/1.73 (ref >59)
GFR calc non Af Amer: 87 mL/min/1.73 (ref >59)
Globulin, Total: 2.3 g/dL (ref 1.5–4.5)
Glucose: 97 mg/dL (ref 65–99)
Potassium: 4.8 mmol/L (ref 3.5–5.2)
Sodium: 142 mmol/L (ref 134–144)
Total Bilirubin: 0.5 mg/dL (ref 0.0–1.2)
Total Protein: 6.9 g/dL (ref 6.0–8.5)

## 2013-07-13 LAB — NMR, LIPOPROFILE
Cholesterol: 182 mg/dL (ref <200)
HDL Cholesterol by NMR: 52 mg/dL (ref >=40)
HDL Particle Number: 37.4 umol/L (ref >=30.5)
LDL Particle Number: 1177 nmol/L — ABNORMAL HIGH (ref <1000)
LDL Size: 20.9 nm (ref >20.5)
LDLC SERPL CALC-MCNC: 107 mg/dL — ABNORMAL HIGH (ref <100)
LP-IR Score: 47 — ABNORMAL HIGH (ref <=45)
Small LDL Particle Number: 167 nmol/L (ref <=527)
Triglycerides by NMR: 116 mg/dL (ref <150)

## 2013-12-29 ENCOUNTER — Other Ambulatory Visit: Payer: Self-pay | Admitting: *Deleted

## 2013-12-29 MED ORDER — LOSARTAN POTASSIUM-HCTZ 100-25 MG PO TABS
1.0000 | ORAL_TABLET | Freq: Every day | ORAL | Status: DC
Start: 2013-12-29 — End: 2014-01-29

## 2014-01-05 ENCOUNTER — Encounter: Payer: Self-pay | Admitting: Internal Medicine

## 2014-01-29 ENCOUNTER — Other Ambulatory Visit: Payer: Self-pay | Admitting: Family Medicine

## 2014-01-31 NOTE — Telephone Encounter (Signed)
Please refill this one more time and at the same time schedule patient for an appointment to be seen

## 2014-01-31 NOTE — Telephone Encounter (Signed)
Patient last seen in 3-15 by Jacelyn Grip. Notified at last refill that NTBS. Please advise on refill

## 2014-03-01 ENCOUNTER — Other Ambulatory Visit: Payer: Self-pay | Admitting: Family Medicine

## 2014-03-02 NOTE — Telephone Encounter (Signed)
Refill per protocol. Appt 12/9 w/ Dr. Sabra Heck.

## 2014-03-29 ENCOUNTER — Other Ambulatory Visit: Payer: Self-pay | Admitting: Family Medicine

## 2014-03-30 ENCOUNTER — Other Ambulatory Visit: Payer: Self-pay | Admitting: *Deleted

## 2014-03-30 MED ORDER — AMLODIPINE BESYLATE 10 MG PO TABS: 10.0000 mg | ORAL_TABLET | Freq: Every day | ORAL | Status: DC

## 2014-03-30 NOTE — Telephone Encounter (Signed)
Refilled appt 12/9 w/ Sabra Heck

## 2014-04-01 ENCOUNTER — Ambulatory Visit (INDEPENDENT_AMBULATORY_CARE_PROVIDER_SITE_OTHER): Payer: Managed Care, Other (non HMO) | Admitting: Family Medicine

## 2014-04-01 ENCOUNTER — Encounter: Payer: Self-pay | Admitting: Family Medicine

## 2014-04-01 VITALS — BP 152/83 | HR 68 | Temp 97.4°F | Ht 68.0 in | Wt 151.0 lb

## 2014-04-01 DIAGNOSIS — Z0000 Encounter for general adult medical examination without abnormal findings: Secondary | ICD-10-CM

## 2014-04-01 DIAGNOSIS — E785 Hyperlipidemia, unspecified: Secondary | ICD-10-CM

## 2014-04-01 DIAGNOSIS — I10 Essential (primary) hypertension: Secondary | ICD-10-CM

## 2014-04-01 DIAGNOSIS — Z125 Encounter for screening for malignant neoplasm of prostate: Secondary | ICD-10-CM

## 2014-04-01 MED ORDER — AMLODIPINE BESYLATE 10 MG PO TABS: 10.0000 mg | ORAL_TABLET | Freq: Every day | ORAL | Status: DC

## 2014-04-01 MED ORDER — LOSARTAN POTASSIUM-HCTZ 100-25 MG PO TABS: 1.0000 | ORAL_TABLET | Freq: Every day | ORAL | Status: DC

## 2014-04-01 NOTE — Progress Notes (Signed)
   Subjective:    Patient ID: Cesar West, male    DOB: [birth year], 58 y.o.   MRN: [id number]  HPI 58 year old gentleman here for an annual physical. He has a history of hypertension and borderline lipid elevation. He has no specific complaints or issues today. He is retiring soon and plans to work on some weight loss and exercise.    Review of Systems  Constitutional: Negative.   HENT: Negative.   Eyes: Negative.   Respiratory: Negative.  Negative for shortness of breath.   Cardiovascular: Negative.  Negative for chest pain and leg swelling.  Gastrointestinal: Negative.   Genitourinary: Negative.   Musculoskeletal: Negative.   Skin: Negative.   Neurological: Negative.   Psychiatric/Behavioral: Negative.   All other systems reviewed and are negative.      Objective:   Physical Exam  Constitutional: He is oriented to person, place, and time. He appears well-developed and well-nourished.  HENT:  Head: Normocephalic.  Right Ear: External ear normal.  Left Ear: External ear normal.  Nose: Nose normal.  Mouth/Throat: Oropharynx is clear and moist.  Eyes: Conjunctivae and EOM are normal. Pupils are equal, round, and reactive to light.  Neck: Normal range of motion. Neck supple.  Cardiovascular: Normal rate, regular rhythm, normal heart sounds and intact distal pulses.   Pulmonary/Chest: Effort normal and breath sounds normal.  Abdominal: Soft. Bowel sounds are normal.  Genitourinary: Prostate normal.  Musculoskeletal: Normal range of motion.  Neurological: He is alert and oriented to person, place, and time.  Skin: Skin is warm and dry.  Psychiatric: He has a normal mood and affect. His behavior is normal. Judgment and thought content normal.    BP 152/83 mmHg  Pulse 68  Temp(Src) 97.4 F (36.3 C) (Oral)  Ht _0  (1.727 m)  Wt 151 lb (68.493 kg)  BMI 22.96 kg/m2      Assessment & Plan:  1. Essential hypertension Plan to monitor pressure at home since it slightly  elevated this morning. May need to add beta blocker or other med for control - CMP14+EGFR  2. Hyperlipidemia  - Lipid panel  3. Annual physical exam Exam is normal  4. Screening for prostate cancer   Wardell Honour MD - PSA, total and free

## 2014-04-01 NOTE — Patient Instructions (Signed)
Continue current medications. Continue good therapeutic lifestyle changes which include good diet and exercise. Fall precautions discussed with patient. If an FOBT was given today- please return it to our front desk.    Check on the cost of shingles shot - Zostzvax If you are over 60 years old - you may need Prevnar 13 or the adult Pneumonia vaccine.  Flu Shots will be available at our office starting mid- September. Please call and schedule a FLU CLINIC APPOINTMENT.

## 2014-04-02 LAB — LIPID PANEL
Chol/HDL Ratio: 4.4 ratio units (ref 0.0–5.0)
Cholesterol, Total: 218 mg/dL — ABNORMAL HIGH (ref 100–199)
HDL: 49 mg/dL (ref >39)
LDL Calculated: 141 mg/dL — ABNORMAL HIGH (ref 0–99)
TRIGLYCERIDES: 141 mg/dL (ref 0–149)
VLDL Cholesterol Cal: 28 mg/dL (ref 5–40)

## 2014-04-02 LAB — CMP14+EGFR
ALT: 35 IU/L (ref 0–44)
AST: 21 IU/L (ref 0–40)
Albumin/Globulin Ratio: 1.7 (ref 1.1–2.5)
Albumin: 4.7 g/dL (ref 3.6–4.8)
Alkaline Phosphatase: 94 IU/L (ref 39–117)
BUN/Creatinine Ratio: 13 (ref 10–22)
BUN: 13 mg/dL (ref 8–27)
CHLORIDE: 96 mmol/L — AB (ref 97–108)
CO2: 26 mmol/L (ref 18–29)
Calcium: 10.2 mg/dL (ref 8.6–10.2)
Creatinine, Ser: 0.98 mg/dL (ref 0.76–1.27)
GFR calc Af Amer: 96 mL/min/{1.73_m2} (ref >59)
GFR calc non Af Amer: 83 mL/min/{1.73_m2} (ref >59)
GLUCOSE: 108 mg/dL — AB (ref 65–99)
Globulin, Total: 2.8 g/dL (ref 1.5–4.5)
Potassium: 4.4 mmol/L (ref 3.5–5.2)
Sodium: 138 mmol/L (ref 134–144)
TOTAL PROTEIN: 7.5 g/dL (ref 6.0–8.5)
Total Bilirubin: 1.3 mg/dL — ABNORMAL HIGH (ref 0.0–1.2)

## 2014-04-02 LAB — PSA, TOTAL AND FREE
PSA FREE PCT: 27.9 %
PSA FREE: 0.53 ng/mL
PSA: 1.9 ng/mL (ref 0.0–4.0)

## 2014-04-06 ENCOUNTER — Telehealth: Payer: Self-pay | Admitting: Family Medicine

## 2014-04-06 NOTE — Telephone Encounter (Signed)
Patient aware of results.

## 2014-04-30 ENCOUNTER — Other Ambulatory Visit: Payer: Self-pay | Admitting: Family Medicine

## 2014-04-30 NOTE — Telephone Encounter (Signed)
Pt notified [facility] into [facility] Verbalizes understanding

## 2014-05-11 ENCOUNTER — Ambulatory Visit (INDEPENDENT_AMBULATORY_CARE_PROVIDER_SITE_OTHER): Payer: Managed Care, Other (non HMO) | Admitting: *Deleted

## 2014-05-11 DIAGNOSIS — Z23 Encounter for immunization: Secondary | ICD-10-CM

## 2014-05-11 NOTE — Patient Instructions (Signed)

## 2014-05-11 NOTE — Progress Notes (Signed)
Pt given Prevnar 13 IM left deltoid, pt tolerated well.

## 2014-07-20 ENCOUNTER — Ambulatory Visit (INDEPENDENT_AMBULATORY_CARE_PROVIDER_SITE_OTHER): Payer: Managed Care, Other (non HMO) | Admitting: *Deleted

## 2014-07-20 DIAGNOSIS — Z23 Encounter for immunization: Secondary | ICD-10-CM | POA: Diagnosis not present

## 2014-07-20 NOTE — Progress Notes (Signed)
Zostavax given and tolerated well

## 2014-07-20 NOTE — Patient Instructions (Signed)
Shingles Vaccine What You Need to Know WHAT IS SHINGLES?  Shingles is a painful skin rash, often with blisters. It is also called Herpes Zoster or just Zoster.  A shingles rash usually appears on one side of the face or body and lasts from 2 to 4 weeks. Its main symptom is pain, which can be quite severe. Other symptoms of shingles can include fever, headache, chills, and upset stomach. Very rarely, a shingles infection can lead to pneumonia, hearing problems, blindness, brain inflammation (encephalitis), or death.  For about 1 person in 5, severe pain can continue even after the rash clears up. This is called post-herpetic neuralgia.  Shingles is caused by the Varicella Zoster virus. This is the same virus that causes chickenpox. Only someone who has had a case of chickenpox or rarely, has gotten chickenpox vaccine, can get shingles. The virus stays in your body. It can reappear many years later to cause a case of shingles.  You cannot catch shingles from another person with shingles. However, a person who has never had chickenpox (or chickenpox vaccine) could get chickenpox from someone with shingles. This is not very common.  Shingles is far more common in people 50 and older than in younger people. It is also more common in people whose immune systems are weakened because of a disease such as cancer or drugs such as steroids or chemotherapy.  At least 1 million people get shingles per year in the [place]. SHINGLES VACCINE  A vaccine for shingles was licensed in 2006. In clinical trials, the vaccine reduced the risk of shingles by 50%. It can also reduce the pain in people who still get shingles after being vaccinated.  A single dose of shingles vaccine is recommended for adults 60 years of age and older. SOME PEOPLE SHOULD NOT GET SHINGLES VACCINE OR SHOULD WAIT A person should not get shingles vaccine if he or she:  Has ever had a life-threatening allergic reaction to gelatin, the  antibiotic neomycin, or any other component of shingles vaccine. Tell your caregiver if you have any severe allergies.  Has a weakened immune system because of current:  AIDS or another disease that affects the immune system.  Treatment with drugs that affect the immune system, such as prolonged use of high-dose steroids.  Cancer treatment, such as radiation or chemotherapy.  Cancer affecting the bone marrow or lymphatic system, such as leukemia or lymphoma.  Is pregnant, or might be pregnant. Women should not become pregnant until at least 4 weeks after getting shingles vaccine. Someone with a minor illness, such as a cold, may be vaccinated. Anyone with a moderate or severe acute illness should usually wait until he or she recovers before getting the vaccine. This includes anyone with a temperature of 101.3 F (38 C) or higher. WHAT ARE THE RISKS FROM SHINGLES VACCINE?  A vaccine, like any medicine, could possibly cause serious problems, such as severe allergic reactions. However, the risk of a vaccine causing serious harm, or death, is extremely small.  No serious problems have been identified with shingles vaccine. Mild Problems  Redness, soreness, swelling, or itching at the site of the injection (about 1 person in 3).  Headache (about 1 person in 70). Like all vaccines, shingles vaccine is being closely monitored for unusual or severe problems. WHAT IF THERE IS A MODERATE OR SEVERE REACTION? What should I look for? Any unusual condition, such as a severe allergic reaction or a high fever. If a severe allergic reaction   occurred, it would be within a few minutes to an hour after the shot. Signs of a serious allergic reaction can include difficulty breathing, weakness, hoarseness or wheezing, a fast heartbeat, hives, dizziness, paleness, or swelling of the throat. What should I do?  Call your caregiver, or get the person to a caregiver right away.  Tell the caregiver what  happened, the date and time it happened, and when the vaccination was given.  Ask the caregiver to report the reaction by filing a Vaccine Adverse Event Reporting System (VAERS) form. Or, you can file this report through the VAERS web site at www.vaers.hhs.gov or by calling 1-800-822-7967. VAERS does not provide medical advice. HOW CAN I LEARN MORE?  Ask your caregiver. He or she can give you the vaccine package insert or suggest other sources of information.  Contact the Centers for Disease Control and Prevention (CDC):  Call 1-800-232-4636 (1-800-CDC-INFO).  Visit the CDC website at www.cdc.gov/vaccines CDC Shingles Vaccine VIS (01/28/08) Document Released: 02/05/2006 Document Revised: 07/03/2011 Document Reviewed: 07/31/2012 Exit[employer] Patient Information 2015 [employer]. This information is not intended to replace advice given to you by your health care provider. Make sure you discuss any questions you have with your health care provider.  

## 2014-09-14 ENCOUNTER — Encounter: Payer: Self-pay | Admitting: Internal Medicine

## 2015-02-05 ENCOUNTER — Telehealth: Payer: Self-pay | Admitting: Family Medicine

## 2015-02-17 ENCOUNTER — Ambulatory Visit (INDEPENDENT_AMBULATORY_CARE_PROVIDER_SITE_OTHER): Payer: 59 | Admitting: Family Medicine

## 2015-02-17 ENCOUNTER — Encounter: Payer: Self-pay | Admitting: Family Medicine

## 2015-02-17 VITALS — BP 129/74 | HR 59 | Temp 97.1°F | Ht 68.0 in | Wt 160.6 lb

## 2015-02-17 DIAGNOSIS — Z0000 Encounter for general adult medical examination without abnormal findings: Secondary | ICD-10-CM

## 2015-02-17 MED ORDER — LOSARTAN POTASSIUM-HCTZ 100-25 MG PO TABS: 1.0000 | ORAL_TABLET | Freq: Every day | ORAL | Status: DC

## 2015-02-17 MED ORDER — AMLODIPINE BESYLATE 10 MG PO TABS: 10.0000 mg | ORAL_TABLET | Freq: Every day | ORAL | Status: DC

## 2015-02-17 NOTE — Progress Notes (Signed)
   Subjective:    Patient ID: Cesar West, male    DOB: [birth year], 59 y.o.   MRN: [id number]  HPI 59 year old gentleman here to follow-up hypertension. He had some elevation of his lipids and his coronary heart disease risk is greater than 10% for 10 years. We discussed this today. He had tried statins previously but could not tolerate them. We plan to check lipids again today and if still elevated consider medicine other than statin such as Zetia.  Patient Active Problem List   Diagnosis Date Noted  . Annual physical exam 03/28/2013  . Unspecified vitamin D deficiency 07/03/2012  . Hearing loss 07/03/2012  . Personal history of colonic polyps 07/03/2012  . HTN (hypertension) 07/19/2010  . Hyperlipidemia 07/19/2010  . BLOOD IN STOOL, OCCULT 10/20/2008   Outpatient Encounter Prescriptions as of 02/17/2015  Medication Sig  . amLODipine (NORVASC) 10 MG tablet Take 1 tablet (10 mg total) by mouth daily.  Marland Kitchen aspirin EC 81 MG tablet Take 81 mg by mouth daily.  . cholecalciferol (VITAMIN D) 1000 UNITS tablet Take 2,000 Units by mouth daily.  . fish oil-omega-3 fatty acids 1000 MG capsule Take 2 g by mouth daily.  Marland Kitchen losartan-hydrochlorothiazide (HYZAAR) 100-25 MG per tablet Take 1 tablet by mouth daily.   No facility-administered encounter medications on file as of 02/17/2015.      Review of Systems  Constitutional: Negative.   HENT: Negative.   Eyes: Negative.   Respiratory: Negative.  Negative for shortness of breath.   Cardiovascular: Negative.  Negative for chest pain and leg swelling.  Gastrointestinal: Negative.   Genitourinary: Negative.   Musculoskeletal: Negative.   Skin: Negative.   Neurological: Negative.   Psychiatric/Behavioral: Negative.   All other systems reviewed and are negative.      Objective:   Physical Exam  Constitutional: He is oriented to person, place, and time. He appears well-developed and well-nourished.  HENT:  Head: Normocephalic.  Right Ear:  External ear normal.  Left Ear: External ear normal.  Nose: Nose normal.  Mouth/Throat: Oropharynx is clear and moist.  Eyes: Conjunctivae and EOM are normal. Pupils are equal, round, and reactive to light.  Neck: Normal range of motion. Neck supple.  Cardiovascular: Normal rate, regular rhythm, normal heart sounds and intact distal pulses.   Pulmonary/Chest: Effort normal and breath sounds normal.  Abdominal: Soft. Bowel sounds are normal.  Musculoskeletal: Normal range of motion.  Neurological: He is alert and oriented to person, place, and time.  Skin: Skin is warm and dry.  Psychiatric: He has a normal mood and affect. His behavior is normal. Judgment and thought content normal.          Assessment & Plan:  1. Annual physical exam Exam is within normal limits blood pressure is well controlled on current regimen. As stated above if lipids are still elevated will consider treatment. Otherwise continue amlodipine and losartan for blood pressure - amLODipine (NORVASC) 10 MG tablet; Take 1 tablet (10 mg total) by mouth daily.  Dispense: 90 tablet; Refill: 3 - losartan-hydrochlorothiazide (HYZAAR) 100-25 MG tablet; Take 1 tablet by mouth daily.  Dispense: 90 tablet;  - Lipid panel  Wardell Honour MD

## 2015-02-18 ENCOUNTER — Telehealth: Payer: Self-pay | Admitting: Family Medicine

## 2015-02-18 LAB — BMP8+EGFR
BUN/Creatinine Ratio: 19 (ref 10–22)
BUN: 19 mg/dL (ref 8–27)
CALCIUM: 9.9 mg/dL (ref 8.6–10.2)
CO2: 27 mmol/L (ref 18–29)
CREATININE: 0.99 mg/dL (ref 0.76–1.27)
Chloride: 98 mmol/L (ref 97–106)
GFR calc Af Amer: 95 mL/min/{1.73_m2} (ref >59)
GFR, EST NON AFRICAN AMERICAN: 82 mL/min/{1.73_m2} (ref >59)
Glucose: 110 mg/dL — ABNORMAL HIGH (ref 65–99)
POTASSIUM: 4.8 mmol/L (ref 3.5–5.2)
Sodium: 139 mmol/L (ref 136–144)

## 2015-02-18 LAB — LIPID PANEL
CHOLESTEROL TOTAL: 175 mg/dL (ref 100–199)
Chol/HDL Ratio: 3.7 ratio units (ref 0.0–5.0)
HDL: 47 mg/dL (ref >39)
LDL Calculated: 110 mg/dL — ABNORMAL HIGH (ref 0–99)
Triglycerides: 90 mg/dL (ref 0–149)
VLDL Cholesterol Cal: 18 mg/dL (ref 5–40)

## 2015-02-18 NOTE — Telephone Encounter (Signed)
Patient aware of results.

## 2016-01-17 DIAGNOSIS — Z23 Encounter for immunization: Secondary | ICD-10-CM | POA: Diagnosis not present

## 2016-01-30 ENCOUNTER — Other Ambulatory Visit: Payer: Self-pay | Admitting: Family Medicine

## 2016-01-30 DIAGNOSIS — Z0000 Encounter for general adult medical examination without abnormal findings: Secondary | ICD-10-CM

## 2016-01-31 NOTE — Progress Notes (Signed)
   Subjective:    Patient ID: Cesar West, male    DOB: 03-09-1956, 60 y.o.   MRN: [id number]  HPI 60 year old gentleman here to follow-up hypertension. He is compliant and having no issues with medication. He has a few areas on his skin on either lateral ankle is within their years and have not changed. No other symptoms are noted.  Patient Active Problem List   Diagnosis Date Noted  . Annual physical exam 03/28/2013  . Unspecified vitamin D deficiency 07/03/2012  . Hearing loss 07/03/2012  . Personal history of colonic polyps 07/03/2012  . HTN (hypertension) 07/19/2010  . Hyperlipidemia 07/19/2010  . BLOOD IN STOOL, OCCULT 10/20/2008   Outpatient Encounter Prescriptions as of 02/01/2016  Medication Sig  . amLODipine (NORVASC) 10 MG tablet Take 1 tablet (10 mg total) by mouth daily.  Marland Kitchen aspirin EC 81 MG tablet Take 81 mg by mouth daily.  . cholecalciferol (VITAMIN D) 1000 UNITS tablet Take 2,000 Units by mouth daily.  . fish oil-omega-3 fatty acids 1000 MG capsule Take 2 g by mouth daily.  Marland Kitchen losartan-hydrochlorothiazide (HYZAAR) 100-25 MG tablet Take 1 tablet by mouth daily.   No facility-administered encounter medications on file as of 02/01/2016.       Review of Systems  Constitutional: Negative.   HENT: Negative.   Eyes: Negative.   Respiratory: Negative.  Negative for shortness of breath.   Cardiovascular: Negative.  Negative for chest pain and leg swelling.  Gastrointestinal: Negative.   Genitourinary: Negative.   Musculoskeletal: Negative.   Skin: Negative.   Neurological: Negative.   Psychiatric/Behavioral: Negative.   All other systems reviewed and are negative.      Objective:   Physical Exam  Constitutional: He is oriented to person, place, and time. He appears well-developed and well-nourished.  Cardiovascular: Normal rate, regular rhythm, normal heart sounds and intact distal pulses.   Pulmonary/Chest: Effort normal and breath sounds normal.    Musculoskeletal: He exhibits no edema.  Neurological: He is alert and oriented to person, place, and time.  Psychiatric: He has a normal mood and affect. His behavior is normal.   BP 132/79   Pulse 64   Temp 97.6 F (36.4 C) (Oral)   Ht 5\' 8"  (1.727 m)   Wt 159 lb 6.4 oz (72.3 kg)   BMI 24.24 kg/m         Assessment & Plan:  1. Annual physical exam - losartan-hydrochlorothiazide (HYZAAR) 100-25 MG tablet; Take 1 tablet by mouth daily.  Dispense: 90 tablet; Refill: 3 - amLODipine (NORVASC) 10 MG tablet; Take 1 tablet (10 mg total) by mouth daily.  Dispense: 90 tablet; Refill: 3  2. Essential hypertension Blood pressure is well controlled. No changes are needed. Review of labs shows no concerns will repeat labs at next visit.  Wardell Honour MD

## 2016-02-01 ENCOUNTER — Ambulatory Visit (INDEPENDENT_AMBULATORY_CARE_PROVIDER_SITE_OTHER): Payer: BLUE CROSS/BLUE SHIELD | Admitting: Family Medicine

## 2016-02-01 ENCOUNTER — Encounter: Payer: Self-pay | Admitting: Family Medicine

## 2016-02-01 VITALS — BP 132/79 | HR 64 | Temp 97.6°F | Ht 68.0 in | Wt 159.4 lb

## 2016-02-01 DIAGNOSIS — I10 Essential (primary) hypertension: Secondary | ICD-10-CM | POA: Diagnosis not present

## 2016-02-01 DIAGNOSIS — Z0000 Encounter for general adult medical examination without abnormal findings: Secondary | ICD-10-CM

## 2016-02-01 MED ORDER — AMLODIPINE BESYLATE 10 MG PO TABS: 10.0000 mg | ORAL_TABLET | Freq: Every day | ORAL | 3 refills | Status: DC

## 2016-02-01 MED ORDER — LOSARTAN POTASSIUM-HCTZ 100-25 MG PO TABS: 1.0000 | ORAL_TABLET | Freq: Every day | ORAL | 3 refills | Status: DC

## 2017-01-16 ENCOUNTER — Other Ambulatory Visit: Payer: Self-pay | Admitting: Family Medicine

## 2017-01-16 DIAGNOSIS — Z0000 Encounter for general adult medical examination without abnormal findings: Secondary | ICD-10-CM

## 2017-01-16 NOTE — Telephone Encounter (Signed)
Next Ov 02/02/17

## 2017-02-02 ENCOUNTER — Ambulatory Visit (INDEPENDENT_AMBULATORY_CARE_PROVIDER_SITE_OTHER): Payer: BLUE CROSS/BLUE SHIELD | Admitting: Family Medicine

## 2017-02-02 ENCOUNTER — Encounter: Payer: Self-pay | Admitting: Family Medicine

## 2017-02-02 VITALS — BP 125/75 | HR 58 | Ht 68.0 in | Wt 170.0 lb

## 2017-02-02 DIAGNOSIS — Z125 Encounter for screening for malignant neoplasm of prostate: Secondary | ICD-10-CM

## 2017-02-02 DIAGNOSIS — I10 Essential (primary) hypertension: Secondary | ICD-10-CM

## 2017-02-02 DIAGNOSIS — Z0000 Encounter for general adult medical examination without abnormal findings: Secondary | ICD-10-CM | POA: Diagnosis not present

## 2017-02-02 DIAGNOSIS — E782 Mixed hyperlipidemia: Secondary | ICD-10-CM | POA: Diagnosis not present

## 2017-02-02 MED ORDER — AMLODIPINE BESYLATE 10 MG PO TABS: 10.0000 mg | ORAL_TABLET | Freq: Every day | ORAL | 3 refills | Status: DC

## 2017-02-02 MED ORDER — LOSARTAN POTASSIUM-HCTZ 100-25 MG PO TABS: 1.0000 | ORAL_TABLET | Freq: Every day | ORAL | 3 refills | Status: DC

## 2017-02-02 NOTE — Progress Notes (Signed)
Subjective:  Patient ID: Cesar West, male    DOB: 31-May-1955  Age: 61 y.o. MRN: [id number]  CC: Annual Exam (pt here today for cpe, no other concerns voiced)   HPI Cesar West presents for CPE  Depression screen [facility] 2/9 02/02/2017 02/01/2016 02/17/2015  Decreased Interest 0 0 0  Down, Depressed, Hopeless 0 0 0  PHQ - 2 Score 0 0 0    History Cesar West has a past medical history of Adenomatous colon polyp (7 /22/ 2010); Cancer ([place]); Diverticulosis of colon (11/12/2008); Hearing loss; Hyperlipidemia; Hypertension; Internal hemorrhoid (11/12/2008); and Vitamin D deficiency.   Cesar West has no past surgical history on file.   His family history includes Heart disease in his father; Hypertension in his father and mother.Cesar West reports that Cesar West has never smoked. Cesar West has never used smokeless tobacco. Cesar West reports that Cesar West does not drink alcohol or use drugs.    ROS Review of Systems  Constitutional: Negative for activity change, appetite change, chills, diaphoresis, fatigue, fever and unexpected weight change.  HENT: Negative for congestion, ear pain, hearing loss, postnasal drip, rhinorrhea, sore throat, tinnitus and trouble swallowing.   Eyes: Negative for photophobia, pain, discharge and redness.  Respiratory: Negative for apnea, cough, choking, chest tightness, shortness of breath, wheezing and stridor.   Cardiovascular: Negative for chest pain, palpitations and leg swelling.  Gastrointestinal: Negative for abdominal distention, abdominal pain, blood in stool, constipation, diarrhea, nausea and vomiting.  Endocrine: Negative for cold intolerance, heat intolerance, polydipsia, polyphagia and polyuria.  Genitourinary: Negative for difficulty urinating, dysuria, enuresis, flank pain, frequency, genital sores, hematuria and urgency.  Musculoskeletal: Negative for arthralgias and joint swelling.  Skin: Negative for color change, rash and wound.  Allergic/Immunologic: Negative for immunocompromised  state.  Neurological: Negative for dizziness, tremors, seizures, syncope, facial asymmetry, speech difficulty, weakness, light-headedness, numbness and headaches.  Hematological: Does not bruise/bleed easily.  Psychiatric/Behavioral: Negative for agitation, behavioral problems, confusion, decreased concentration, dysphoric mood, hallucinations, sleep disturbance and suicidal ideas. The patient is not nervous/anxious and is not hyperactive.     Objective:  BP 125/75   Pulse (!) 58   Ht _0  (1.727 m)   Wt 170 lb (77.1 kg)   BMI 25.85 kg/m   BP Readings from Last 3 Encounters:  02/02/17 125/75  02/01/16 132/79  02/17/15 129/74    Wt Readings from Last 3 Encounters:  02/02/17 170 lb (77.1 kg)  02/01/16 159 lb 6.4 oz (72.3 kg)  02/17/15 160 lb 9.6 oz (72.8 kg)     Physical Exam  Constitutional: Cesar West is oriented to person, place, and time. Cesar West appears well-developed and well-nourished.  HENT:  Head: Normocephalic and atraumatic.  Mouth/Throat: Oropharynx is clear and moist.  Eyes: Pupils are equal, round, and reactive to light. EOM are normal.  Neck: Normal range of motion. No tracheal deviation present. No thyromegaly present.  Cardiovascular: Normal rate, regular rhythm and normal heart sounds.  Exam reveals no gallop and no friction rub.   No murmur heard. Pulmonary/Chest: Breath sounds normal. Cesar West has no wheezes. Cesar West has no rales.  Abdominal: Soft. Cesar West exhibits no mass. There is no tenderness.  Musculoskeletal: Normal range of motion. Cesar West exhibits no edema.  Neurological: Cesar West is alert and oriented to person, place, and time.  Skin: Skin is warm and dry.  Psychiatric: Cesar West has a normal mood and affect.      Assessment & Plan:   Cesar West was seen today for annual exam.  Diagnoses and all orders for this visit:  Annual physical exam -     CBC with Differential/Platelet -     CMP14+EGFR -     Lipid panel -     PSA, total and free -     VITAMIN D 25 Hydroxy (Vit-D Deficiency,  Fractures) -     amLODipine (NORVASC) 10 MG tablet; Take 1 tablet (10 mg total) by mouth daily. -     losartan-hydrochlorothiazide (HYZAAR) 100-25 MG tablet; Take 1 tablet by mouth daily.  Essential hypertension -     CBC with Differential/Platelet -     CMP14+EGFR -     Lipid panel  Mixed hyperlipidemia -     Lipid panel  Screening for prostate cancer -     PSA, total and free       I am having Cesar West maintain his fish oil-omega-3 fatty acids, cholecalciferol, aspirin EC, amLODipine, and losartan-hydrochlorothiazide.  Allergies as of 02/02/2017   No Known Allergies     Medication List       Accurate as of 02/02/17 11:59 PM. Always use your most recent med list.          amLODipine 10 MG tablet Commonly known as:  NORVASC Take 1 tablet (10 mg total) by mouth daily.   aspirin EC 81 MG tablet Take 81 mg by mouth daily.   cholecalciferol 1000 units tablet Commonly known as:  VITAMIN D Take 2,000 Units by mouth daily.   fish oil-omega-3 fatty acids 1000 MG capsule Take 2 g by mouth daily.   losartan-hydrochlorothiazide 100-25 MG tablet Commonly known as:  HYZAAR Take 1 tablet by mouth daily.        Follow-up: Return in about 6 months (around 08/03/2017) for hypertension, cholesterol.  Claretta Fraise, M.D.

## 2017-02-04 LAB — LIPID PANEL
CHOL/HDL RATIO: 4 ratio (ref 0.0–5.0)
Cholesterol, Total: 189 mg/dL (ref 100–199)
HDL: 47 mg/dL (ref >39)
LDL Calculated: 114 mg/dL — ABNORMAL HIGH (ref 0–99)
Triglycerides: 140 mg/dL (ref 0–149)
VLDL CHOLESTEROL CAL: 28 mg/dL (ref 5–40)

## 2017-02-04 LAB — CBC WITH DIFFERENTIAL/PLATELET
BASOS ABS: 0.1 10*3/uL (ref 0.0–0.2)
Basos: 1 %
EOS (ABSOLUTE): 0.3 10*3/uL (ref 0.0–0.4)
Eos: 3 %
HEMOGLOBIN: 14.6 g/dL (ref 13.0–17.7)
Hematocrit: 44.8 % (ref 37.5–51.0)
IMMATURE GRANS (ABS): 0 10*3/uL (ref 0.0–0.1)
IMMATURE GRANULOCYTES: 0 %
Lymphocytes Absolute: 2 10*3/uL (ref 0.7–3.1)
Lymphs: 25 %
MCH: 28.7 pg (ref 26.6–33.0)
MCHC: 32.6 g/dL (ref 31.5–35.7)
MCV: 88 fL (ref 79–97)
Monocytes Absolute: 1 10*3/uL — ABNORMAL HIGH (ref 0.1–0.9)
Monocytes: 12 %
NEUTROS PCT: 59 %
Neutrophils Absolute: 4.6 10*3/uL (ref 1.4–7.0)
PLATELETS: 241 10*3/uL (ref 150–379)
RBC: 5.08 x10E6/uL (ref 4.14–5.80)
RDW: 13.1 % (ref 12.3–15.4)
WBC: 7.8 10*3/uL (ref 3.4–10.8)

## 2017-02-04 LAB — CMP14+EGFR
ALT: 32 IU/L (ref 0–44)
AST: 21 IU/L (ref 0–40)
Albumin/Globulin Ratio: 1.6 (ref 1.2–2.2)
Albumin: 4.5 g/dL (ref 3.6–4.8)
Alkaline Phosphatase: 93 IU/L (ref 39–117)
BUN/Creatinine Ratio: 12 (ref 10–24)
BUN: 12 mg/dL (ref 8–27)
Bilirubin Total: 1.4 mg/dL — ABNORMAL HIGH (ref 0.0–1.2)
CALCIUM: 9.9 mg/dL (ref 8.6–10.2)
CO2: 25 mmol/L (ref 20–29)
Chloride: 100 mmol/L (ref 96–106)
Creatinine, Ser: 0.98 mg/dL (ref 0.76–1.27)
GFR, EST AFRICAN AMERICAN: 94 mL/min/{1.73_m2} (ref >59)
GFR, EST NON AFRICAN AMERICAN: 82 mL/min/{1.73_m2} (ref >59)
GLUCOSE: 106 mg/dL — AB (ref 65–99)
Globulin, Total: 2.8 g/dL (ref 1.5–4.5)
Potassium: 4.2 mmol/L (ref 3.5–5.2)
Sodium: 140 mmol/L (ref 134–144)
TOTAL PROTEIN: 7.3 g/dL (ref 6.0–8.5)

## 2017-02-04 LAB — VITAMIN D 25 HYDROXY (VIT D DEFICIENCY, FRACTURES): VIT D 25 HYDROXY: 41.4 ng/mL (ref 30.0–100.0)

## 2017-02-04 LAB — PSA, TOTAL AND FREE
PSA, Free Pct: 36.3 %
PSA, Free: 0.58 ng/mL
Prostate Specific Ag, Serum: 1.6 ng/mL (ref 0.0–4.0)

## 2017-06-14 ENCOUNTER — Other Ambulatory Visit: Payer: Self-pay | Admitting: Family Medicine

## 2017-06-14 ENCOUNTER — Telehealth: Payer: Self-pay

## 2017-06-14 ENCOUNTER — Encounter: Payer: Self-pay | Admitting: Internal Medicine

## 2017-06-14 DIAGNOSIS — Z1211 Encounter for screening for malignant neoplasm of colon: Secondary | ICD-10-CM

## 2017-06-14 NOTE — Telephone Encounter (Signed)
Wants a referral for a colonoscopy

## 2017-08-30 ENCOUNTER — Ambulatory Visit (AMBULATORY_SURGERY_CENTER): Payer: Self-pay

## 2017-08-30 VITALS — Ht 67.0 in | Wt 172.0 lb

## 2017-08-30 DIAGNOSIS — Z86010 Personal history of colonic polyps: Secondary | ICD-10-CM

## 2017-08-30 NOTE — Progress Notes (Signed)
Per pt, no allergies to soy or egg products.Pt not taking any weight loss meds or using  O2 at home.  Pt refused emmi video. 

## 2017-08-31 ENCOUNTER — Encounter: Payer: Self-pay | Admitting: Internal Medicine

## 2017-09-13 ENCOUNTER — Encounter: Payer: Self-pay | Admitting: Internal Medicine

## 2017-11-14 ENCOUNTER — Other Ambulatory Visit: Payer: Self-pay

## 2017-11-14 ENCOUNTER — Encounter (HOSPITAL_COMMUNITY): Payer: Self-pay | Admitting: *Deleted

## 2017-11-14 ENCOUNTER — Emergency Department (HOSPITAL_COMMUNITY): Payer: BLUE CROSS/BLUE SHIELD

## 2017-11-14 ENCOUNTER — Emergency Department: Payer: Self-pay

## 2017-11-14 ENCOUNTER — Emergency Department (HOSPITAL_COMMUNITY)
Admission: EM | Admit: 2017-11-14 | Discharge: 2017-11-14 | Disposition: A | Discharge status: Home / Self Care | Payer: BLUE CROSS/BLUE SHIELD | Attending: Emergency Medicine | Admitting: Emergency Medicine

## 2017-11-14 DIAGNOSIS — Y939 Activity, unspecified: Secondary | ICD-10-CM | POA: Diagnosis not present

## 2017-11-14 DIAGNOSIS — W312XXA Contact with powered woodworking and forming machines, initial encounter: Secondary | ICD-10-CM | POA: Insufficient documentation

## 2017-11-14 DIAGNOSIS — S61212A Laceration without foreign body of right middle finger without damage to nail, initial encounter: Secondary | ICD-10-CM | POA: Diagnosis not present

## 2017-11-14 DIAGNOSIS — I10 Essential (primary) hypertension: Secondary | ICD-10-CM | POA: Insufficient documentation

## 2017-11-14 DIAGNOSIS — S61210A Laceration without foreign body of right index finger without damage to nail, initial encounter: Secondary | ICD-10-CM | POA: Diagnosis not present

## 2017-11-14 DIAGNOSIS — Z7982 Long term (current) use of aspirin: Secondary | ICD-10-CM | POA: Insufficient documentation

## 2017-11-14 DIAGNOSIS — Z23 Encounter for immunization: Secondary | ICD-10-CM | POA: Diagnosis not present

## 2017-11-14 DIAGNOSIS — S6992XA Unspecified injury of left wrist, hand and finger(s), initial encounter: Secondary | ICD-10-CM | POA: Diagnosis not present

## 2017-11-14 DIAGNOSIS — S61310A Laceration without foreign body of right index finger with damage to nail, initial encounter: Secondary | ICD-10-CM | POA: Insufficient documentation

## 2017-11-14 DIAGNOSIS — Z79899 Other long term (current) drug therapy: Secondary | ICD-10-CM | POA: Insufficient documentation

## 2017-11-14 DIAGNOSIS — S61214A Laceration without foreign body of right ring finger without damage to nail, initial encounter: Secondary | ICD-10-CM | POA: Diagnosis not present

## 2017-11-14 DIAGNOSIS — Y999 Unspecified external cause status: Secondary | ICD-10-CM | POA: Insufficient documentation

## 2017-11-14 DIAGNOSIS — Y92831 Amusement park as the place of occurrence of the external cause: Secondary | ICD-10-CM | POA: Diagnosis not present

## 2017-11-14 DIAGNOSIS — R52 Pain, unspecified: Secondary | ICD-10-CM

## 2017-11-14 DIAGNOSIS — S61412A Laceration without foreign body of left hand, initial encounter: Secondary | ICD-10-CM | POA: Diagnosis not present

## 2017-11-14 DIAGNOSIS — S61319A Laceration without foreign body of unspecified finger with damage to nail, initial encounter: Secondary | ICD-10-CM

## 2017-11-14 DIAGNOSIS — M79645 Pain in left finger(s): Secondary | ICD-10-CM | POA: Diagnosis not present

## 2017-11-14 DIAGNOSIS — Z6826 Body mass index (BMI) 26.0-26.9, adult: Secondary | ICD-10-CM | POA: Diagnosis not present

## 2017-11-14 MED ORDER — LIDOCAINE HCL 2 % IJ SOLN
20.0000 mL | Freq: Once | INTRAMUSCULAR | Status: AC
  Administered 2017-11-14: 400 mg
  Filled 2017-11-14: qty 20

## 2017-11-14 MED ORDER — DOXYCYCLINE HYCLATE 100 MG PO CAPS
100.0000 mg | ORAL_CAPSULE | Freq: Two times a day (BID) | ORAL | 0 refills | Status: DC
Start: 2017-11-14 — End: 2018-02-04

## 2017-11-14 MED ORDER — DOXYCYCLINE HYCLATE 100 MG PO TABS
100.0000 mg | ORAL_TABLET | Freq: Once | ORAL | Status: AC
  Administered 2017-11-14: 100 mg via ORAL
  Filled 2017-11-14: qty 1

## 2017-11-14 NOTE — ED Provider Notes (Addendum)
Patient placed in Quick Look pathway, seen and evaluated   Chief Complaint: hand injury left  HPI:   Cesar West is a 62 y.o. male who presents to the ED with laceration to the fingers of the left hand after he accidentally cut them with a table saw. Patient went to an Urgent Care and had x-ray and tetanus shot. Patient reports he was sent here to see hand surgeon.  ROS: Skin: lacerations  Physical Exam:  BP 137/85 (BP Location: Right Arm)   Pulse 68   Temp 98 F (36.7 C) (Oral)   Resp 20   SpO2 100%    Gen: No distress  Neuro: Awake and Alert  Skin: lacerations to the tips of the index, middle and ring fingers on the left.       Initiation of care has begun. The patient has been counseled on the process, plan, and necessity for staying for the completion/evaluation, and the remainder of the medical screening examination    Ashley Murrain, NP 11/14/17 [place], NP 11/17/17 1604    Virgel Manifold, MD 11/18/17 [phone]

## 2017-11-14 NOTE — ED Triage Notes (Signed)
Pt in c/o injury to his left hand from a table saw, went to an urgent care and was sent here to see a hand specialist, injury to distal end of fingers 2-4

## 2017-11-14 NOTE — Discharge Instructions (Addendum)
Please read attached information. If you experience any new or worsening signs or symptoms please return to the emergency room for evaluation. Please follow-up with your primary care provider or specialist as discussed. Please use medication prescribed only as directed and discontinue taking if you have any concerning signs or symptoms.   °

## 2017-11-14 NOTE — ED Provider Notes (Signed)
[facility] EMERGENCY DEPARTMENT Provider Note   CSN: [id number] Arrival date & time: 11/14/17  1644     History   Chief Complaint Chief Complaint  Patient presents with  . Hand Injury    HPI Cesar West is a 62 y.o. male.  HPI   62 year old male presents today with injury to his left hand. Patient notes he was using a saw when he cut the fourth third and second digits of his left hand. Patient notes this happened approximately 10 AM this morning, he was seen at urgent care the wounds were cleansed and assessed, plain films were completed. Patient was referred here for further evaluation and management. Patient had his tetanus updated at the urgent care. He has full active range of motion of his fingers, with sensation intact.  Past Medical History:  Diagnosis Date  . Adenomatous colon polyp 7 /22/ 2010  . Cancer ([place])    basal cell skin of the right triceps area  . Diverticulosis of colon 11/12/2008   left colon  . Hearing loss    right ear  . Hyperlipidemia   . Hypertension   . Internal hemorrhoid 11/12/2008  . Vitamin D deficiency     Patient Active Problem List   Diagnosis Date Noted  . Annual physical exam 03/28/2013  . Unspecified vitamin D deficiency 07/03/2012  . Hearing loss 07/03/2012  . Personal history of colonic polyps 07/03/2012  . HTN (hypertension) 07/19/2010  . Hyperlipidemia 07/19/2010  . BLOOD IN STOOL, OCCULT 10/20/2008    Past Surgical History:  Procedure Laterality Date  . COLONOSCOPY          Home Medications    Prior to Admission medications   Medication Sig Start Date End Date Taking? Authorizing Provider  amLODipine (NORVASC) 10 MG tablet Take 1 tablet (10 mg total) by mouth daily. 02/02/17   Claretta Fraise, MD  aspirin EC 81 MG tablet Take 81 mg by mouth daily.    [provider]  bisacodyl (DULCOLAX) 5 MG EC tablet Take 5 mg by mouth. Dulcolax 5 mg bowel prep #4-Take as directed    [provider]  cholecalciferol (VITAMIN D) 1000 UNITS tablet Take 2,000 Units by mouth daily.    [provider]  doxycycline (VIBRAMYCIN) 100 MG capsule Take 1 capsule (100 mg total) by mouth 2 (two) times daily. 11/14/17   Mariella Blackwelder, Dellis Filbert, PA-C  fish oil-omega-3 fatty acids 1000 MG capsule Take 2 g by mouth daily.    [provider]  losartan-hydrochlorothiazide (HYZAAR) 100-25 MG tablet Take 1 tablet by mouth daily. 02/02/17   Claretta Fraise, MD  Polyethylene Glycol 3350 (MIRALAX PO) Take by mouth. Miralax 238 gm bowel prep-Take as directed    [provider]    Family History Family History  Problem Relation Age of Onset  . Hypertension Mother   . Hypertension Father   . Heart disease Father   . Hypertension Brother     Social History Social History   Tobacco Use  . Smoking status: Never Smoker  . Smokeless tobacco: Never Used  Substance Use Topics  . Alcohol use: No  . Drug use: No     Allergies   Statins   Review of Systems Review of Systems  All other systems reviewed and are negative.  Physical Exam Updated Vital Signs BP 137/85 (BP Location: Right Arm)   Pulse 68   Temp 98 F (36.7 C) (Oral)   Resp 20   SpO2 100%  Physical Exam  Constitutional: He is oriented to person, place, and time. He appears well-developed and well-nourished.  HENT:  Head: Normocephalic and atraumatic.  Eyes: Pupils are equal, round, and reactive to light. Conjunctivae are normal. Right eye exhibits no discharge. Left eye exhibits no discharge. No scleral icterus.  Neck: Normal range of motion. No JVD present. No tracheal deviation present.  Pulmonary/Chest: Effort normal. No stridor.  Musculoskeletal:  Macerated 1 cm laceration to the distal aspect of the left second digit with tissue loss, unable to palpate bone laceration extends into the distal nail bed with missing nail plate at the distal aspect- full active range of motion of the finger  1 cm  laceration over the palmar aspect of the distal third finger- unable to palpate bone. Range of motion of the finger  2 1 cm lacerations over the distal palmar aspect of the right fourth finger unable to palpate bone full range of motion  Neurological: He is alert and oriented to person, place, and time. Coordination normal.  Psychiatric: He has a normal mood and affect. His behavior is normal. Judgment and thought content normal.  Nursing note and vitals reviewed.    ED Treatments / Results  Labs (all labs ordered are listed, but only abnormal results are displayed) Labs Reviewed - No data to display  EKG None  Radiology Dg Hand Complete Left  Result Date: 11/14/2017 CLINICAL DATA:  Initial evaluation for acute trauma, laceration. EXAM: LEFT HAND - COMPLETE 3+ VIEW COMPARISON:  Prior radiograph from 11/14/2017 FINDINGS: Bandaging material overlies the second through fourth digits. Underlying soft tissue irregularity consistent with laceration, most notable at the fourth digit. No radiopaque foreign body. Tuft total irregularity at the fourth distal phalanx similar to previous exam, consistent with a chronic finding. No acute fracture or dislocation. Retained metallic densities within the left fifth digit noted, stable. IMPRESSION: 1. Soft tissue injury/laceration at the distal aspect of the second through fourth digits, most severe at the fourth digit. No radiopaque foreign body. 2. No acute fracture or dislocation. Electronically Signed   By: Jeannine Boga M.D.   On: 11/14/2017 17:55    Procedures Procedures (including critical care time)  LACERATION REPAIR Performed by: Stevie Kern Johnson [place] Authorized by: Stevie Kern Vaniya Augspurger Consent: Verbal consent obtained. Risks and benefits: risks, benefits and alternatives were discussed Consent given by: patient Patient identity confirmed: provided demographic data Prepped and Draped in normal sterile fashion Wound  explored  Laceration Location: 2nd, 3rd, and 4th fingers right hand  Laceration Length: 2nd digit- 1 cm, 3rd digit 1 cm, 4th digit 2 1 cm laceration  No Foreign Bodies seen or palpated  Anesthesia: digital blocks of 2nd, 3rd, and 4th digits  Local anesthetic: lidocaine 2% without epinephrine  Anesthetic total: 10cc  ml  Irrigation method: syringe Amount of cleaning: standard  Skin closure: interrupted  Number of sutures: second digit for 6. 0 Vicryl rapides, third digit 4x 5.0 Vicryl rapids, fourth digit 4x 5.0 Vicryl rapise = total 14  Technique: all stitches were simple interrupted  Patient tolerance: Patient tolerated the procedure well with no immediate complications.  Medications Ordered in ED Medications  doxycycline (VIBRA-TABS) tablet 100 mg (has no administration in time range)  lidocaine (XYLOCAINE) 2 % (with pres) injection 400 mg (400 mg Infiltration Given 11/14/17 1833)     Initial Impression / Assessment and Plan / ED Course  I have reviewed the triage vital signs and the nursing notes.  Pertinent labs & imaging results that  were available during my care of the patient were reviewed by me and considered in my medical decision making (see chart for details).     Labs:   Imaging: DG incomplete left  Consults:  Therapeutics:doxycycline, lidocaine  Discharge Meds: doxycycline  Assessment/Plan: 62 year old male presents today with lacerations to his finger. No deep space involvement full active range of motion of the fingers, gait and features at this time that require further evaluation or management by hand specialty. Patient placed on prophylactic antibiotics after wound repair. Return profession skin, he verbalized understanding and agreement to today's plan.      Final Clinical Impressions(s) / ED Diagnoses   Final diagnoses:  Laceration of finger of left hand without foreign body with damage to nail, unspecified finger, initial encounter     ED Discharge Orders        Ordered    doxycycline (VIBRAMYCIN) 100 MG capsule  2 times daily     11/14/17 2006       Francee Gentile 11/14/17 2008    Dorie Rank, MD 11/14/17 2056

## 2018-02-04 ENCOUNTER — Encounter: Payer: Self-pay | Admitting: Family Medicine

## 2018-02-04 ENCOUNTER — Ambulatory Visit (INDEPENDENT_AMBULATORY_CARE_PROVIDER_SITE_OTHER): Payer: BLUE CROSS/BLUE SHIELD | Admitting: Family Medicine

## 2018-02-04 VITALS — BP 122/66 | HR 67 | Temp 98.3°F | Ht 67.0 in | Wt 168.0 lb

## 2018-02-04 DIAGNOSIS — Z0000 Encounter for general adult medical examination without abnormal findings: Secondary | ICD-10-CM | POA: Diagnosis not present

## 2018-02-04 DIAGNOSIS — E782 Mixed hyperlipidemia: Secondary | ICD-10-CM

## 2018-02-04 DIAGNOSIS — Z23 Encounter for immunization: Secondary | ICD-10-CM

## 2018-02-04 DIAGNOSIS — I10 Essential (primary) hypertension: Secondary | ICD-10-CM | POA: Diagnosis not present

## 2018-02-04 DIAGNOSIS — L720 Epidermal cyst: Secondary | ICD-10-CM

## 2018-02-04 LAB — URINALYSIS
BILIRUBIN UA: NEGATIVE
GLUCOSE, UA: NEGATIVE
KETONES UA: NEGATIVE
Leukocytes, UA: NEGATIVE
NITRITE UA: NEGATIVE
Protein, UA: NEGATIVE
RBC UA: NEGATIVE
SPEC GRAV UA: 1.015 (ref 1.005–1.030)
UUROB: 0.2 mg/dL (ref 0.2–1.0)
pH, UA: 5.5 (ref 5.0–7.5)

## 2018-02-04 MED ORDER — AMLODIPINE BESYLATE 10 MG PO TABS: 10.0000 mg | ORAL_TABLET | Freq: Every day | ORAL | 3 refills | Status: DC

## 2018-02-04 MED ORDER — LOSARTAN POTASSIUM-HCTZ 100-25 MG PO TABS: 1.0000 | ORAL_TABLET | Freq: Every day | ORAL | 3 refills | Status: DC

## 2018-02-04 NOTE — Progress Notes (Signed)
Subjective:  Patient ID: [name], male    DOB: 09/15/1955  Age: 62 y.o. MRN: [id number]  CC: Annual Exam (CPE); Hyperlipidemia; and Hypertension   HPI [employer] presents for annual physical examination.   Follow-up of hypertension. Patient has no history of headache chest pain or shortness of breath or recent cough. Patient also denies symptoms of TIA such as numbness weakness lateralizing. Patient checks  blood pressure at home and has not had any elevated readings recently. Patient denies side effects from his medication. States taking it regularly.   Patient using fish oil 2 g daily for cholesterol control.  Depression screen [facility] 2/9 02/04/2018 02/02/2017 02/01/2016  Decreased Interest 1 0 0  Down, Depressed, Hopeless 0 0 0  PHQ - 2 Score 1 0 0    History Kim has a past medical history of Adenomatous colon polyp (7 /22/ 2010), Cancer [facility]), Diverticulosis of colon (11/12/2008), Hearing loss, Hyperlipidemia, Hypertension, Internal hemorrhoid (11/12/2008), and Vitamin D deficiency.   He has a past surgical history that includes Colonoscopy.   His family history includes Heart disease in his father; Hypertension in his brother, father, and mother.He reports that he has never smoked. He has never used smokeless tobacco. He reports that he does not drink alcohol or use drugs.    ROS Review of Systems  Constitutional: Negative for activity change, fatigue and unexpected weight change.  HENT: Negative for congestion, ear pain, hearing loss, postnasal drip and trouble swallowing.   Eyes: Negative for pain and visual disturbance.  Respiratory: Negative for cough, chest tightness and shortness of breath.   Cardiovascular: Negative for chest pain, palpitations and leg swelling.  Gastrointestinal: Negative for abdominal distention, abdominal pain, blood in stool, constipation, diarrhea, nausea and vomiting.  Endocrine: Negative for cold intolerance, heat intolerance and  polydipsia.  Genitourinary: Negative for difficulty urinating, dysuria, flank pain, frequency and urgency.       She is concerned about a lesion that came up on the scrotum a few months ago.  No pain.  Is not been growing actively either.  It has not been red or swollen  Musculoskeletal: Negative for arthralgias and joint swelling.  Skin: Negative for color change, rash and wound.  Neurological: Negative for dizziness, syncope, speech difficulty, weakness, light-headedness, numbness and headaches.  Hematological: Does not bruise/bleed easily.  Psychiatric/Behavioral: Negative for confusion, decreased concentration, dysphoric mood and sleep disturbance. The patient is not nervous/anxious.     Objective:  BP 122/66   Pulse 67   Temp 98.3 F (36.8 C) (Oral)   Ht 5' 7"  (1.702 m)   Wt 168 lb (76.2 kg)   BMI 26.31 kg/m   BP Readings from Last 3 Encounters:  02/04/18 122/66  11/14/17 137/85  02/02/17 125/75    Wt Readings from Last 3 Encounters:  02/04/18 168 lb (76.2 kg)  08/30/17 172 lb (78 kg)  02/02/17 170 lb (77.1 kg)     Physical Exam  Constitutional: He is oriented to person, place, and time. He appears well-developed and well-nourished.  HENT:  Head: Normocephalic and atraumatic.  Mouth/Throat: Oropharynx is clear and moist.  Eyes: Pupils are equal, round, and reactive to light. EOM are normal.  Neck: Normal range of motion. No tracheal deviation present. No thyromegaly present.  Cardiovascular: Normal rate, regular rhythm and normal heart sounds. Exam reveals no gallop and no friction rub.  No murmur heard. Pulmonary/Chest: Breath sounds normal. He has no wheezes. He has no rales.  Abdominal: Soft. Bowel sounds are  normal. He exhibits no distension and no mass. There is no tenderness. Hernia confirmed negative in the right inguinal area and confirmed negative in the left inguinal area.  Genitourinary: Testes normal and penis normal. Circumcised.  Genitourinary Comments:  The cyst is located in the right lateral aspect of the anterior scrotum.  It measures 3 mm and has a whitehead.  This represents an epidermal inclusion cyst.  Musculoskeletal: Normal range of motion. He exhibits no edema.  Lymphadenopathy:    He has no cervical adenopathy.  Neurological: He is alert and oriented to person, place, and time.  Skin: Skin is warm and dry.  Psychiatric: He has a normal mood and affect.      Assessment & Plan:   Remington was seen today for annual exam, hyperlipidemia and hypertension.  Diagnoses and all orders for this visit:  Annual physical exam -     CBC with Differential/Platelet -     CMP14+EGFR -     Lipid panel -     Urinalysis -     PSA, total and free -     VITAMIN D 25 Hydroxy (Vit-D Deficiency, Fractures) -     losartan-hydrochlorothiazide (HYZAAR) 100-25 MG tablet; Take 1 tablet by mouth daily. -     amLODipine (NORVASC) 10 MG tablet; Take 1 tablet (10 mg total) by mouth daily.  Mixed hyperlipidemia -     Lipid panel  Essential hypertension -     CMP14+EGFR  Need for immunization against influenza -     Flu Vaccine QUAD 36+ mos IM  Epidermal inclusion cyst       I have discontinued Stryder Crull's doxycycline. I am also having him maintain his fish oil-omega-3 fatty acids, cholecalciferol, aspirin EC, Polyethylene Glycol 3350 (MIRALAX PO), bisacodyl, losartan-hydrochlorothiazide, and amLODipine.  Allergies as of 02/04/2018      Reactions   Statins    Caused laziness      Medication List        Accurate as of 02/04/18  6:03 PM. Always use your most recent med list.          amLODipine 10 MG tablet Commonly known as:  NORVASC Take 1 tablet (10 mg total) by mouth daily.   aspirin EC 81 MG tablet Take 81 mg by mouth daily.   bisacodyl 5 MG EC tablet Commonly known as:  DULCOLAX Take 5 mg by mouth. Dulcolax 5 mg bowel prep #4-Take as directed   cholecalciferol 1000 units tablet Commonly known as:  VITAMIN D Take  2,000 Units by mouth daily.   fish oil-omega-3 fatty acids 1000 MG capsule Take 2 g by mouth daily.   losartan-hydrochlorothiazide 100-25 MG tablet Commonly known as:  HYZAAR Take 1 tablet by mouth daily.   MIRALAX PO Take by mouth. Miralax 238 gm bowel prep-Take as directed       Reassured regarding the cyst.  Follow-up if it grows or becomes painful red or drains Follow-up: Return in about 1 year (around 02/05/2019).  Claretta Fraise, M.D.

## 2018-02-05 LAB — LIPID PANEL
Chol/HDL Ratio: 3.9 ratio (ref 0.0–5.0)
Cholesterol, Total: 197 mg/dL (ref 100–199)
HDL: 50 mg/dL (ref >39)
LDL CALC: 123 mg/dL — AB (ref 0–99)
Triglycerides: 119 mg/dL (ref 0–149)
VLDL CHOLESTEROL CAL: 24 mg/dL (ref 5–40)

## 2018-02-05 LAB — CMP14+EGFR
ALBUMIN: 4.7 g/dL (ref 3.6–4.8)
ALT: 37 IU/L (ref 0–44)
AST: 25 IU/L (ref 0–40)
Albumin/Globulin Ratio: 1.8 (ref 1.2–2.2)
Alkaline Phosphatase: 97 IU/L (ref 39–117)
BILIRUBIN TOTAL: 1 mg/dL (ref 0.0–1.2)
BUN / CREAT RATIO: 11 (ref 10–24)
BUN: 11 mg/dL (ref 8–27)
CALCIUM: 9.6 mg/dL (ref 8.6–10.2)
CHLORIDE: 101 mmol/L (ref 96–106)
CO2: 25 mmol/L (ref 20–29)
CREATININE: 1.01 mg/dL (ref 0.76–1.27)
GFR, EST AFRICAN AMERICAN: 90 mL/min/{1.73_m2} (ref >59)
GFR, EST NON AFRICAN AMERICAN: 78 mL/min/{1.73_m2} (ref >59)
GLUCOSE: 108 mg/dL — AB (ref 65–99)
Globulin, Total: 2.6 g/dL (ref 1.5–4.5)
Potassium: 3.8 mmol/L (ref 3.5–5.2)
Sodium: 142 mmol/L (ref 134–144)
Total Protein: 7.3 g/dL (ref 6.0–8.5)

## 2018-02-05 LAB — CBC WITH DIFFERENTIAL/PLATELET
BASOS ABS: 0.1 10*3/uL (ref 0.0–0.2)
BASOS: 1 %
EOS (ABSOLUTE): 0.2 10*3/uL (ref 0.0–0.4)
Eos: 2 %
Hematocrit: 46.4 % (ref 37.5–51.0)
Hemoglobin: 15.4 g/dL (ref 13.0–17.7)
IMMATURE GRANS (ABS): 0 10*3/uL (ref 0.0–0.1)
Immature Granulocytes: 0 %
LYMPHS: 29 %
Lymphocytes Absolute: 2.1 10*3/uL (ref 0.7–3.1)
MCH: 27.9 pg (ref 26.6–33.0)
MCHC: 33.2 g/dL (ref 31.5–35.7)
MCV: 84 fL (ref 79–97)
Monocytes Absolute: 0.8 10*3/uL (ref 0.1–0.9)
Monocytes: 10 %
NEUTROS ABS: 4.2 10*3/uL (ref 1.4–7.0)
Neutrophils: 58 %
Platelets: 251 10*3/uL (ref 150–450)
RBC: 5.52 x10E6/uL (ref 4.14–5.80)
RDW: 12.2 % — ABNORMAL LOW (ref 12.3–15.4)
WBC: 7.4 10*3/uL (ref 3.4–10.8)

## 2018-02-05 LAB — PSA, TOTAL AND FREE
PROSTATE SPECIFIC AG, SERUM: 2.1 ng/mL (ref 0.0–4.0)
PSA FREE: 0.73 ng/mL
PSA, Free Pct: 34.8 %

## 2018-02-05 LAB — VITAMIN D 25 HYDROXY (VIT D DEFICIENCY, FRACTURES): Vit D, 25-Hydroxy: 46.7 ng/mL (ref 30.0–100.0)

## 2018-02-05 NOTE — Progress Notes (Signed)
Hello Cesar West,  Your lab result is normal.Some minor variations that are not significant are commonly marked abnormal, but do not represent any medical problem for you.  Best regards, Brecken Dewoody, M.D.

## 2018-02-20 ENCOUNTER — Other Ambulatory Visit: Payer: Self-pay | Admitting: Family Medicine

## 2018-09-23 ENCOUNTER — Encounter: Payer: Self-pay | Admitting: Family

## 2018-09-23 ENCOUNTER — Other Ambulatory Visit: Payer: Self-pay

## 2018-09-23 ENCOUNTER — Ambulatory Visit (INDEPENDENT_AMBULATORY_CARE_PROVIDER_SITE_OTHER): Payer: Medicare Other | Admitting: Family

## 2018-09-23 VITALS — BP 137/80 | HR 72 | Temp 97.5°F | Ht 67.0 in | Wt 166.6 lb

## 2018-09-23 DIAGNOSIS — W57XXXA Bitten or stung by nonvenomous insect and other nonvenomous arthropods, initial encounter: Secondary | ICD-10-CM

## 2018-09-23 DIAGNOSIS — S20462A Insect bite (nonvenomous) of left back wall of thorax, initial encounter: Secondary | ICD-10-CM | POA: Diagnosis not present

## 2018-09-23 DIAGNOSIS — Z23 Encounter for immunization: Secondary | ICD-10-CM | POA: Diagnosis not present

## 2018-09-23 MED ORDER — DOXYCYCLINE HYCLATE 100 MG PO TABS: 200.0000 mg | ORAL_TABLET | Freq: Once | ORAL | 0 refills | Status: AC

## 2018-09-23 NOTE — Progress Notes (Signed)
   Subjective:    Patient ID: Cesar West, male    DOB: [birth year], 63 y.o.   MRN: [id number]  Chief Complaint  Patient presents with  . tick on back    HPI Pt presents to the office today for tick removal. He states he noticed the tick this morning in the lower part of his back. States he lives at home by himself and could not reach the tick.  States he was in his yard yesterday mowing his grass. He is unsure how long the tick has been attached, but feels like he got it yesterday while mowing.  Denies any fever, rash, headache, or joint pain.    Review of Systems  All other systems reviewed and are negative.      Objective:   Physical Exam Vitals signs reviewed.  Constitutional:      General: He is not in acute distress.    Appearance: He is well-developed.  Neck:     Musculoskeletal: Normal range of motion and neck supple.     Thyroid: No thyromegaly.  Cardiovascular:     Rate and Rhythm: Normal rate and regular rhythm.     Heart sounds: Normal heart sounds. No murmur.  Pulmonary:     Effort: Pulmonary effort is normal. No respiratory distress.     Breath sounds: Normal breath sounds. No wheezing.  Abdominal:     General: Bowel sounds are normal. There is no distension.     Palpations: Abdomen is soft.     Tenderness: There is no abdominal tenderness.  Musculoskeletal: Normal range of motion.        General: No tenderness.  Skin:    General: Skin is warm and dry.     Findings: No erythema or rash.          Comments: Tick attached to middle of back  Neurological:     Mental Status: He is alert and oriented to person, place, and time.     Cranial Nerves: No cranial nerve deficit.     Deep Tendon Reflexes: Reflexes are normal and symmetric.  Psychiatric:        Behavior: Behavior normal.        Thought Content: Thought content normal.        Judgment: Judgment normal.    Tick removed intact with no complications. Pt tolerated well.     BP 137/80   Pulse  72   Temp (!) 97.5 F (36.4 C) (Oral)   Ht 5\' 7"  (1.702 m)   Wt 166 lb 9.6 oz (75.6 kg)   BMI 26.09 kg/m      Assessment & Plan:  Rambo Sarafian comes in today with chief complaint of tick on back   Diagnosis and orders addressed:  1. Tick bite, initial encounter -Pt to report any new fever, joint pain, or rash -Wear protective clothing while outside- Long sleeves and long pants -Put insect repellent on all exposed skin and along clothing -Take a shower as soon as possible after being outside -RTO as needed - doxycycline (VIBRA-TABS) 100 MG tablet; Take 2 tablets (200 mg total) by mouth once for 1 dose.  Dispense: 2 tablet; Refill: 0   Evelina Dun, FNP

## 2018-09-23 NOTE — Patient Instructions (Signed)

## 2019-02-05 ENCOUNTER — Other Ambulatory Visit: Payer: Self-pay

## 2019-02-06 ENCOUNTER — Ambulatory Visit (INDEPENDENT_AMBULATORY_CARE_PROVIDER_SITE_OTHER): Payer: Medicare Other | Admitting: Family Medicine

## 2019-02-06 ENCOUNTER — Encounter: Payer: Self-pay | Admitting: Family Medicine

## 2019-02-06 VITALS — BP 129/72 | HR 61 | Temp 97.5°F | Resp 20 | Ht 67.0 in | Wt 169.0 lb

## 2019-02-06 DIAGNOSIS — H6593 Unspecified nonsuppurative otitis media, bilateral: Secondary | ICD-10-CM

## 2019-02-06 DIAGNOSIS — I10 Essential (primary) hypertension: Secondary | ICD-10-CM | POA: Diagnosis not present

## 2019-02-06 DIAGNOSIS — Z114 Encounter for screening for human immunodeficiency virus [HIV]: Secondary | ICD-10-CM | POA: Diagnosis not present

## 2019-02-06 DIAGNOSIS — Z0000 Encounter for general adult medical examination without abnormal findings: Secondary | ICD-10-CM | POA: Diagnosis not present

## 2019-02-06 DIAGNOSIS — Z23 Encounter for immunization: Secondary | ICD-10-CM | POA: Diagnosis not present

## 2019-02-06 DIAGNOSIS — Z125 Encounter for screening for malignant neoplasm of prostate: Secondary | ICD-10-CM | POA: Diagnosis not present

## 2019-02-06 DIAGNOSIS — H905 Unspecified sensorineural hearing loss: Secondary | ICD-10-CM

## 2019-02-06 DIAGNOSIS — E782 Mixed hyperlipidemia: Secondary | ICD-10-CM | POA: Diagnosis not present

## 2019-02-06 DIAGNOSIS — E559 Vitamin D deficiency, unspecified: Secondary | ICD-10-CM | POA: Diagnosis not present

## 2019-02-06 DIAGNOSIS — Z1211 Encounter for screening for malignant neoplasm of colon: Secondary | ICD-10-CM

## 2019-02-06 LAB — URINALYSIS
Bilirubin, UA: NEGATIVE
Glucose, UA: NEGATIVE
Ketones, UA: NEGATIVE
Leukocytes,UA: NEGATIVE
Nitrite, UA: NEGATIVE
Protein,UA: NEGATIVE
Specific Gravity, UA: 1.025 (ref 1.005–1.030)
Urobilinogen, Ur: 0.2 mg/dL (ref 0.2–1.0)
pH, UA: 6.5 (ref 5.0–7.5)

## 2019-02-06 MED ORDER — FEXOFENADINE-PSEUDOEPHED ER 180-240 MG PO TB24: 1.0000 | ORAL_TABLET | Freq: Every evening | ORAL | 11 refills | Status: DC

## 2019-02-06 MED ORDER — LOSARTAN POTASSIUM-HCTZ 100-25 MG PO TABS: 1.0000 | ORAL_TABLET | Freq: Every day | ORAL | 3 refills | Status: DC

## 2019-02-06 MED ORDER — AMOXICILLIN-POT CLAVULANATE 875-125 MG PO TABS: 1.0000 | ORAL_TABLET | Freq: Two times a day (BID) | ORAL | 0 refills | Status: DC

## 2019-02-06 NOTE — Progress Notes (Signed)
Subjective:  Patient ID: Cesar West, male    DOB: 13-Mar-1956  Age: 63 y.o. MRN: [id number]  CC: Medical Management of Chronic Issues   HPI Randeep Biondolillo presents for annual exam with follow up on colon polyposis, hypertension, cholesterol and hearing loss.  presents for  follow-up of hypertension. Patient has no history of headache chest pain or shortness of breath or recent cough. Patient also denies symptoms of TIA such as focal numbness or weakness. Patient denies side effects from medication. States taking it regularly.  in for follow-up of elevated cholesterol. Doing well without complaints while currently taking OTC fish oil. Currently no chest pain, shortness of breath or other cardiovascular related symptoms noted.   Depression screen [facility] 2/9 02/06/2019 09/23/2018 02/04/2018  Decreased Interest 0 - 1  Down, Depressed, Hopeless 0 0 0  PHQ - 2 Score 0 0 1  Altered sleeping - 0 -  PHQ-9 Score - 0 -    History Kia has a past medical history of Adenomatous colon polyp (7 /22/ 2010), Cancer [facility]), Diverticulosis of colon (11/12/2008), Hearing loss, Hyperlipidemia, Hypertension, Internal hemorrhoid (11/12/2008), and Vitamin D deficiency.   He has a past surgical history that includes Colonoscopy.   His family history includes Heart disease in his father; Hypertension in his brother, father, and mother.He reports that he has never smoked. He has never used smokeless tobacco. He reports that he does not drink alcohol or use drugs.    ROS Review of Systems  Constitutional: Negative for activity change, fatigue and unexpected weight change.  HENT: Positive for hearing loss (when he bends over he can hear better temporarily. Also when tilting his head backwaard). Negative for congestion, ear discharge, ear pain, postnasal drip and trouble swallowing.   Eyes: Negative for pain and visual disturbance.  Respiratory: Negative for cough, chest tightness and shortness of breath.    Cardiovascular: Negative for chest pain, palpitations and leg swelling.  Gastrointestinal: Negative for abdominal distention, abdominal pain, blood in stool, constipation, diarrhea, nausea and vomiting.  Endocrine: Negative for cold intolerance, heat intolerance and polydipsia.  Genitourinary: Negative for difficulty urinating, dysuria, flank pain, frequency and urgency.  Musculoskeletal: Negative for arthralgias and joint swelling.  Skin: Negative for color change, rash and wound.  Neurological: Negative for dizziness, syncope, speech difficulty, weakness, light-headedness, numbness and headaches.  Hematological: Does not bruise/bleed easily.  Psychiatric/Behavioral: Negative for confusion, decreased concentration, dysphoric mood and sleep disturbance. The patient is not nervous/anxious.     Objective:  BP 129/72   Pulse 61   Temp (!) 97.5 F (36.4 C) (Temporal)   Resp 20   Ht _0  (1.702 m)   Wt 169 lb (76.7 kg)   SpO2 98%   BMI 26.47 kg/m   BP Readings from Last 3 Encounters:  02/06/19 129/72  09/23/18 137/80  02/04/18 122/66    Wt Readings from Last 3 Encounters:  02/06/19 169 lb (76.7 kg)  09/23/18 166 lb 9.6 oz (75.6 kg)  02/04/18 168 lb (76.2 kg)     Physical Exam Constitutional:      Appearance: He is well-developed.  HENT:     Head: Normocephalic and atraumatic.     Ears:     Comments: Fluid behind each TM "glue ear" with round perf at the lower right TM - pt. Denies having had tudes in the past Eyes:     Pupils: Pupils are equal, round, and reactive to light.  Neck:     Musculoskeletal: Normal range of motion.  Thyroid: No thyromegaly.     Trachea: No tracheal deviation.  Cardiovascular:     Rate and Rhythm: Normal rate and regular rhythm.     Heart sounds: Normal heart sounds. No murmur. No friction rub. No gallop.   Pulmonary:     Breath sounds: Normal breath sounds. No wheezing or rales.  Abdominal:     General: Bowel sounds are normal. There  is no distension.     Palpations: Abdomen is soft. There is no mass.     Tenderness: There is no abdominal tenderness.     Hernia: There is no hernia in the left inguinal area.  Genitourinary:    Penis: Normal.      Scrotum/Testes: Normal.  Musculoskeletal: Normal range of motion.  Lymphadenopathy:     Cervical: No cervical adenopathy.  Skin:    General: Skin is warm and dry.  Neurological:     Mental Status: He is alert and oriented to person, place, and time.       Assessment & Plan:   Anvith was seen today for medical management of chronic issues.  Diagnoses and all orders for this visit:  Vitamin D deficiency -     VITAMIN D 25 Hydroxy (Vit-D Deficiency, Fractures)  Annual physical exam -     losartan-hydrochlorothiazide (HYZAAR) 100-25 MG tablet; Take 1 tablet by mouth daily. -     CBC with Differential/Platelet -     CMP14+EGFR -     Urinalysis  Essential hypertension -     CBC with Differential/Platelet -     CMP14+EGFR -     Urinalysis  Mixed hyperlipidemia -     CBC with Differential/Platelet -     CMP14+EGFR -     Lipid panel  Sensorineural hearing loss (SNHL), unspecified laterality  Screening for prostate cancer -     PSA Total (Reflex To Free)  Screen for colon cancer -     Ambulatory referral to Gastroenterology  Encounter for screening for HIV -     HIV Antibody (routine testing w rflx)  Need for immunization against influenza -     Flu Vaccine QUAD High Dose(Fluad)  Bilateral otitis media with effusion  Other orders -     amoxicillin-clavulanate (AUGMENTIN) 875-125 MG tablet; Take 1 tablet by mouth 2 (two) times daily. Take all of this medication -     fexofenadine-pseudoephedrine (ALLEGRA-D 24) 180-240 MG 24 hr tablet; Take 1 tablet by mouth every evening. For allergy and congestion       I have discontinued Welcome Bisping's Polyethylene Glycol 3350 (MIRALAX PO) and bisacodyl. I am also having him start on amoxicillin-clavulanate  and fexofenadine-pseudoephedrine. Additionally, I am having him maintain his fish oil-omega-3 fatty acids, cholecalciferol, aspirin EC, amLODipine, and losartan-hydrochlorothiazide.  Allergies as of 02/06/2019      Reactions   Statins    Caused laziness      Medication List       Accurate as of February 06, 2019 11:41 AM. If you have any questions, ask your nurse or doctor.        STOP taking these medications   bisacodyl 5 MG EC tablet Commonly known as: DULCOLAX Stopped by: Claretta Fraise, MD   [place] by: Claretta Fraise, MD     TAKE these medications   amLODipine 10 MG tablet Commonly known as: NORVASC Take 1 tablet (10 mg total) by mouth daily.   amoxicillin-clavulanate 875-125 MG tablet Commonly known as: AUGMENTIN Take 1 tablet by mouth 2 (  two) times daily. Take all of this medication Started by: Claretta Fraise, MD   aspirin EC 81 MG tablet Take 81 mg by mouth daily.   cholecalciferol 1000 units tablet Commonly known as: VITAMIN D Take 2,000 Units by mouth daily.   fexofenadine-pseudoephedrine 180-240 MG 24 hr tablet Commonly known as: ALLEGRA-D 24 Take 1 tablet by mouth every evening. For allergy and congestion Started by: Claretta Fraise, MD   fish oil-omega-3 fatty acids 1000 MG capsule Take 2 g by mouth daily.   losartan-hydrochlorothiazide 100-25 MG tablet Commonly known as: HYZAAR Take 1 tablet by mouth daily.      There are several moles scattered over your body. They all look harmless. Just watch for changes in size, shape and a darkening of their color. The cyst at your upper back is harmless unless it gets infected. If that happens, it would need to be removed.  Follow-up: Return in about 1 year (around 02/06/2020).  Claretta Fraise, M.D.

## 2019-02-06 NOTE — Patient Instructions (Signed)
There are several moles scattered over your body. They all look harmless. Just watch for changes in size, shape and a darkening of their color. The cyst at your upper back is harmless unless it gets infected. If that happens, it would need to be removed.

## 2019-02-07 LAB — CMP14+EGFR
ALT: 42 IU/L (ref 0–44)
AST: 29 IU/L (ref 0–40)
Albumin/Globulin Ratio: 1.5 (ref 1.2–2.2)
Albumin: 4.6 g/dL (ref 3.8–4.8)
Alkaline Phosphatase: 106 IU/L (ref 39–117)
BUN/Creatinine Ratio: 11 (ref 10–24)
BUN: 12 mg/dL (ref 8–27)
Bilirubin Total: 1.4 mg/dL — ABNORMAL HIGH (ref 0.0–1.2)
CO2: 21 mmol/L (ref 20–29)
Calcium: 9.7 mg/dL (ref 8.6–10.2)
Chloride: 98 mmol/L (ref 96–106)
Creatinine, Ser: 1.09 mg/dL (ref 0.76–1.27)
GFR calc Af Amer: 82 mL/min/{1.73_m2} (ref >59)
GFR calc non Af Amer: 71 mL/min/{1.73_m2} (ref >59)
Globulin, Total: 3 g/dL (ref 1.5–4.5)
Glucose: 107 mg/dL — ABNORMAL HIGH (ref 65–99)
Potassium: 3.5 mmol/L (ref 3.5–5.2)
Sodium: 140 mmol/L (ref 134–144)
Total Protein: 7.6 g/dL (ref 6.0–8.5)

## 2019-02-07 LAB — LIPID PANEL
Chol/HDL Ratio: 4.3 ratio (ref 0.0–5.0)
Cholesterol, Total: 216 mg/dL — ABNORMAL HIGH (ref 100–199)
HDL: 50 mg/dL (ref >39)
LDL Chol Calc (NIH): 145 mg/dL — ABNORMAL HIGH (ref 0–99)
Triglycerides: 116 mg/dL (ref 0–149)
VLDL Cholesterol Cal: 21 mg/dL (ref 5–40)

## 2019-02-07 LAB — CBC WITH DIFFERENTIAL/PLATELET
Basophils Absolute: 0.1 10*3/uL (ref 0.0–0.2)
Basos: 1 %
EOS (ABSOLUTE): 0.2 10*3/uL (ref 0.0–0.4)
Eos: 2 %
Hematocrit: 45.6 % (ref 37.5–51.0)
Hemoglobin: 15.4 g/dL (ref 13.0–17.7)
Immature Grans (Abs): 0.1 10*3/uL (ref 0.0–0.1)
Immature Granulocytes: 1 %
Lymphocytes Absolute: 2.2 10*3/uL (ref 0.7–3.1)
Lymphs: 29 %
MCH: 29.2 pg (ref 26.6–33.0)
MCHC: 33.8 g/dL (ref 31.5–35.7)
MCV: 86 fL (ref 79–97)
Monocytes Absolute: 1 10*3/uL — ABNORMAL HIGH (ref 0.1–0.9)
Monocytes: 13 %
Neutrophils Absolute: 4.1 10*3/uL (ref 1.4–7.0)
Neutrophils: 54 %
Platelets: 264 10*3/uL (ref 150–450)
RBC: 5.28 x10E6/uL (ref 4.14–5.80)
RDW: 12.8 % (ref 11.6–15.4)
WBC: 7.5 10*3/uL (ref 3.4–10.8)

## 2019-02-07 LAB — PSA TOTAL (REFLEX TO FREE): Prostate Specific Ag, Serum: 2.7 ng/mL (ref 0.0–4.0)

## 2019-02-07 LAB — HIV ANTIBODY (ROUTINE TESTING W REFLEX): HIV Screen 4th Generation wRfx: NONREACTIVE

## 2019-02-07 LAB — VITAMIN D 25 HYDROXY (VIT D DEFICIENCY, FRACTURES): Vit D, 25-Hydroxy: 39.5 ng/mL (ref 30.0–100.0)

## 2019-02-13 ENCOUNTER — Encounter: Payer: Self-pay | Admitting: Internal Medicine

## 2019-03-12 ENCOUNTER — Other Ambulatory Visit: Payer: Self-pay

## 2019-03-12 ENCOUNTER — Ambulatory Visit (AMBULATORY_SURGERY_CENTER): Payer: Medicare Other | Admitting: *Deleted

## 2019-03-12 VITALS — Temp 96.9°F | Ht 67.0 in | Wt 170.0 lb

## 2019-03-12 DIAGNOSIS — Z86010 Personal history of colonic polyps: Secondary | ICD-10-CM

## 2019-03-12 DIAGNOSIS — Z1159 Encounter for screening for other viral diseases: Secondary | ICD-10-CM

## 2019-03-12 NOTE — Progress Notes (Signed)
No egg or soy allergy known to patient  No issues with past sedation with any surgeries  or procedures, no intubation problems  No diet pills per patient No home 02 use per patient  No blood thinners per patient  Pt denies issues with constipation  No A fib or A flutter  EMMI video sent to pt's e mail   cov test 11-30 1010 am [facility]   Due to the COVID-19 pandemic we are asking patients to follow these guidelines. Please only bring one care partner. Please be aware that your care partner may wait in the car in the parking lot or if they feel like they will be too hot to wait in the car, they may wait in the lobby on the 4th floor. All care partners are required to wear a mask the entire time (we do not have any that we can provide them), they need to practice social distancing, and we will do a Covid check for all patient's and care partners when you arrive. Also we will check their temperature and your temperature. If the care partner waits in their car they need to stay in the parking lot the entire time and we will call them on their cell phone when the patient is ready for discharge so they can bring the car to the front of the building. Also all patient's will need to wear a mask into building.

## 2019-03-24 ENCOUNTER — Other Ambulatory Visit: Payer: Self-pay

## 2019-03-24 ENCOUNTER — Other Ambulatory Visit (HOSPITAL_COMMUNITY)
Admission: RE | Admit: 2019-03-24 | Discharge: 2019-03-24 | Disposition: A | Discharge status: Home / Self Care | Payer: Medicare Other | Source: Ambulatory Visit | Attending: Internal Medicine | Admitting: Internal Medicine

## 2019-03-24 DIAGNOSIS — Z01812 Encounter for preprocedural laboratory examination: Secondary | ICD-10-CM | POA: Diagnosis not present

## 2019-03-24 DIAGNOSIS — Z20828 Contact with and (suspected) exposure to other viral communicable diseases: Secondary | ICD-10-CM | POA: Diagnosis not present

## 2019-03-24 LAB — SARS CORONAVIRUS 2 (TAT 6-24 HRS): SARS Coronavirus 2: NEGATIVE

## 2019-03-26 ENCOUNTER — Encounter: Payer: Self-pay | Admitting: Internal Medicine

## 2019-03-26 ENCOUNTER — Ambulatory Visit (AMBULATORY_SURGERY_CENTER): Payer: Medicare Other | Admitting: Internal Medicine

## 2019-03-26 ENCOUNTER — Other Ambulatory Visit: Payer: Self-pay

## 2019-03-26 VITALS — BP 130/82 | HR 62 | Temp 98.6°F | Resp 10 | Ht 67.0 in | Wt 170.0 lb

## 2019-03-26 DIAGNOSIS — D123 Benign neoplasm of transverse colon: Secondary | ICD-10-CM | POA: Diagnosis not present

## 2019-03-26 DIAGNOSIS — Z86010 Personal history of colonic polyps: Secondary | ICD-10-CM | POA: Diagnosis not present

## 2019-03-26 DIAGNOSIS — D124 Benign neoplasm of descending colon: Secondary | ICD-10-CM | POA: Diagnosis not present

## 2019-03-26 DIAGNOSIS — D125 Benign neoplasm of sigmoid colon: Secondary | ICD-10-CM

## 2019-03-26 MED ORDER — SODIUM CHLORIDE 0.9 % IV SOLN: 500.0000 mL | Freq: Once | INTRAVENOUS | Status: DC

## 2019-03-26 NOTE — Progress Notes (Signed)
PT taken to PACU. Monitors in place. VSS. Report given to RN. 

## 2019-03-26 NOTE — Progress Notes (Signed)
Called to room to assist during endoscopic procedure.  Patient ID and intended procedure confirmed with present staff. Received instructions for my participation in the procedure from the performing physician.  

## 2019-03-26 NOTE — Op Note (Signed)
[facility] Patient Name: Cesar West Procedure Date: 03/26/2019 9:50 AM MRN: [id number] Endoscopist: Gatha Mayer , MD Age: 63 Referring MD:  Date of Birth: Oct 02, 1955 Gender: Male Account #: [id number] Procedure:                Colonoscopy Indications:              Surveillance: Personal history of adenomatous                            polyps on last colonoscopy > 5 years ago Medicines:                Propofol per Anesthesia, Monitored Anesthesia Care Procedure:                Pre-Anesthesia Assessment:                           - Prior to the procedure, a History and Physical                            was performed, and patient medications and                            allergies were reviewed. The patient's tolerance of                            previous anesthesia was also reviewed. The risks                            and benefits of the procedure and the sedation                            options and risks were discussed with the patient.                            All questions were answered, and informed consent                            was obtained. Prior Anticoagulants: The patient has                            taken no previous anticoagulant or antiplatelet                            agents. ASA Grade Assessment: II - A patient with                            mild systemic disease. After reviewing the risks                            and benefits, the patient was deemed in                            satisfactory condition to undergo the procedure.  After obtaining informed consent, the colonoscope                            was passed under direct vision. Throughout the                            procedure, the patient's blood pressure, pulse, and                            oxygen saturations were monitored continuously. The                            Colonoscope was introduced through the anus and   advanced to the the cecum, identified by                            appendiceal orifice and ileocecal valve. The                            colonoscopy was performed without difficulty. The                            patient tolerated the procedure well. The quality                            of the bowel preparation was excellent. The bowel                            preparation used was Miralax via split dose                            instruction. The ileocecal valve, appendiceal                            orifice, and rectum were photographed. Scope In: 10:07:58 AM Scope Out: 10:24:34 AM Scope Withdrawal Time: 0 hours 13 minutes 32 seconds  Total Procedure Duration: 0 hours 16 minutes 36 seconds  Findings:                 The perianal and digital rectal examinations were                            normal. Pertinent negatives include normal prostate                            (size, shape, and consistency).                           Three sessile polyps were found in the sigmoid                            colon, descending colon and transverse colon. The                            polyps were diminutive in size. These  polyps were                            removed with a cold snare. Resection and retrieval                            were complete. Verification of patient                            identification for the specimen was done. Estimated                            blood loss was minimal.                           Multiple diverticula were found in the sigmoid                            colon.                           Internal hemorrhoids were found.                           The exam was otherwise without abnormality on                            direct and retroflexion views. Complications:            No immediate complications. Estimated Blood Loss:     Estimated blood loss was minimal. Impression:               - Three diminutive polyps in the sigmoid colon, in                             the descending colon and in the transverse colon,                            removed with a cold snare. Resected and retrieved.                           - Diverticulosis in the sigmoid colon.                           - Internal hemorrhoids.                           - The examination was otherwise normal on direct                            and retroflexion views.                           - Personal history of colonic polyps. 2010 1 right                            hyperplastic and  3 adenomas Recommendation:           - Patient has a contact number available for                            emergencies. The signs and symptoms of potential                            delayed complications were discussed with the                            patient. Return to normal activities tomorrow.                            Written discharge instructions were provided to the                            patient.                           - Resume previous diet.                           - Continue present medications.                           - Repeat colonoscopy is recommended for                            surveillance. The colonoscopy date will be                            determined after pathology results from today's                            exam become available for review. Gatha Mayer, MD 03/26/2019 10:34:51 AM This report has been signed electronically.

## 2019-03-26 NOTE — Patient Instructions (Addendum)
Handouts given:  Hemorrhoids, polyps, diverticulosis   I found and removed 3 tiny polyps. I will let you know pathology results and when to have another routine colonoscopy by mail and/or My Chart.  You also have a condition called diverticulosis - common and not usually a problem. Please read the handout provided.  Hemorrhoids were also swollen. If you have hemorrhoid problems (swelling, itching, bleeding) I am able to treat those with an in-office procedure. If you like, please call my office at [phone] to schedule an appointment and I can evaluate you further.  I appreciate the opportunity to care for you. Gatha Mayer, MD, FACG YOU HAD AN ENDOSCOPIC PROCEDURE TODAY AT [facility]:   Refer to the procedure report that was given to you for any specific questions about what was found during the examination.  If the procedure report does not answer your questions, please call your gastroenterologist to clarify.  If you requested that your care partner not be given the details of your procedure findings, then the procedure report has been included in a sealed envelope for you to review at your convenience later.  YOU SHOULD EXPECT: Some feelings of bloating in the abdomen. Passage of more gas than usual.  Walking can help get rid of the air that was put into your GI tract during the procedure and reduce the bloating. If you had a lower endoscopy (such as a colonoscopy or flexible sigmoidoscopy) you may notice spotting of blood in your stool or on the toilet paper. If you underwent a bowel prep for your procedure, you may not have a normal bowel movement for a few days.  Please Note:  You might notice some irritation and congestion in your nose or some drainage.  This is from the oxygen used during your procedure.  There is no need for concern and it should clear up in a day or so.  SYMPTOMS TO REPORT IMMEDIATELY:   Following lower endoscopy (colonoscopy or flexible  sigmoidoscopy):  Excessive amounts of blood in the stool  Significant tenderness or worsening of abdominal pains  Swelling of the abdomen that is new, acute  Fever of 100F or higher   For urgent or emergent issues, a gastroenterologist can be reached at any hour by calling [phone].   DIET:  We do recommend a small meal at first, but then you may proceed to your regular diet.  Drink plenty of fluids but you should avoid alcoholic beverages for 24 hours.  ACTIVITY:  You should plan to take it easy for the rest of today and you should NOT DRIVE or use heavy machinery until tomorrow (because of the sedation medicines used during the test).    FOLLOW UP: Our staff will call the number listed on your records 48-72 hours following your procedure to check on you and address any questions or concerns that you may have regarding the information given to you following your procedure. If we do not reach you, we will leave a message.  We will attempt to reach you two times.  During this call, we will ask if you have developed any symptoms of COVID 19. If you develop any symptoms (ie: fever, flu-like symptoms, shortness of breath, cough etc.) before then, please call [phone].  If you test positive for Covid 19 in the 2 weeks post procedure, please call and report this information to [place].    If any biopsies were taken you will be contacted by phone  or by letter within the next 1-3 weeks.  Please call us at [phone] if you have not heard about the biopsies in 3 weeks.    SIGNATURES/CONFIDENTIALITY: You and/or your care partner have signed paperwork which will be entered into your electronic medical record.  These signatures attest to the fact that that the information above on your After Visit Summary has been reviewed and is understood.  Full responsibility of the confidentiality of this discharge information lies with you and/or your care-partner.

## 2019-03-28 ENCOUNTER — Telehealth: Payer: Self-pay | Admitting: *Deleted

## 2019-03-28 NOTE — Telephone Encounter (Signed)
  Follow up Call-  Call back number 03/26/2019  Post procedure Call Back phone  # [phone]  Permission to leave phone message Yes  Some recent data might be hidden     Patient questions:  Do you have a fever, pain , or abdominal swelling? No. Pain Score  0 *  Have you tolerated food without any problems? Yes.    Have you been able to return to your normal activities? Yes.    Do you have any questions about your discharge instructions: Diet   No. Medications  No. Follow up visit  No.  Do you have questions or concerns about your Care? No.  Actions: * If pain score is 4 or above: No action needed, pain <4.  1. Have you developed a fever since your procedure? no  2.   Have you had an respiratory symptoms (SOB or cough) since your procedure? no  3.   Have you tested positive for COVID 19 since your procedure no  4.   Have you had any family members/close contacts diagnosed with the COVID 19 since your procedure?  no   If yes to any of these questions please route to Joylene John, RN and Alphonsa Gin, Therapist, sports.

## 2019-03-30 ENCOUNTER — Encounter: Payer: Self-pay | Admitting: Internal Medicine

## 2019-03-30 NOTE — Progress Notes (Signed)
3 diminutive adenomas recall 2025 My Chart letter

## 2019-04-08 ENCOUNTER — Other Ambulatory Visit: Payer: Self-pay | Admitting: Family Medicine

## 2019-04-08 DIAGNOSIS — Z0000 Encounter for general adult medical examination without abnormal findings: Secondary | ICD-10-CM

## 2019-04-08 NOTE — Telephone Encounter (Signed)
OV 02/06/19 rtc 1 yr

## 2019-09-29 IMAGING — CR DG HAND COMPLETE 3+V*L*
3 series · 3 of 3 positions shown · non-contrast
Comparison: Prior radiograph from 11/14/2017

CLINICAL DATA: Initial evaluation for acute trauma, laceration.

EXAM:
LEFT HAND - COMPLETE 3+ VIEW

[hand pa]
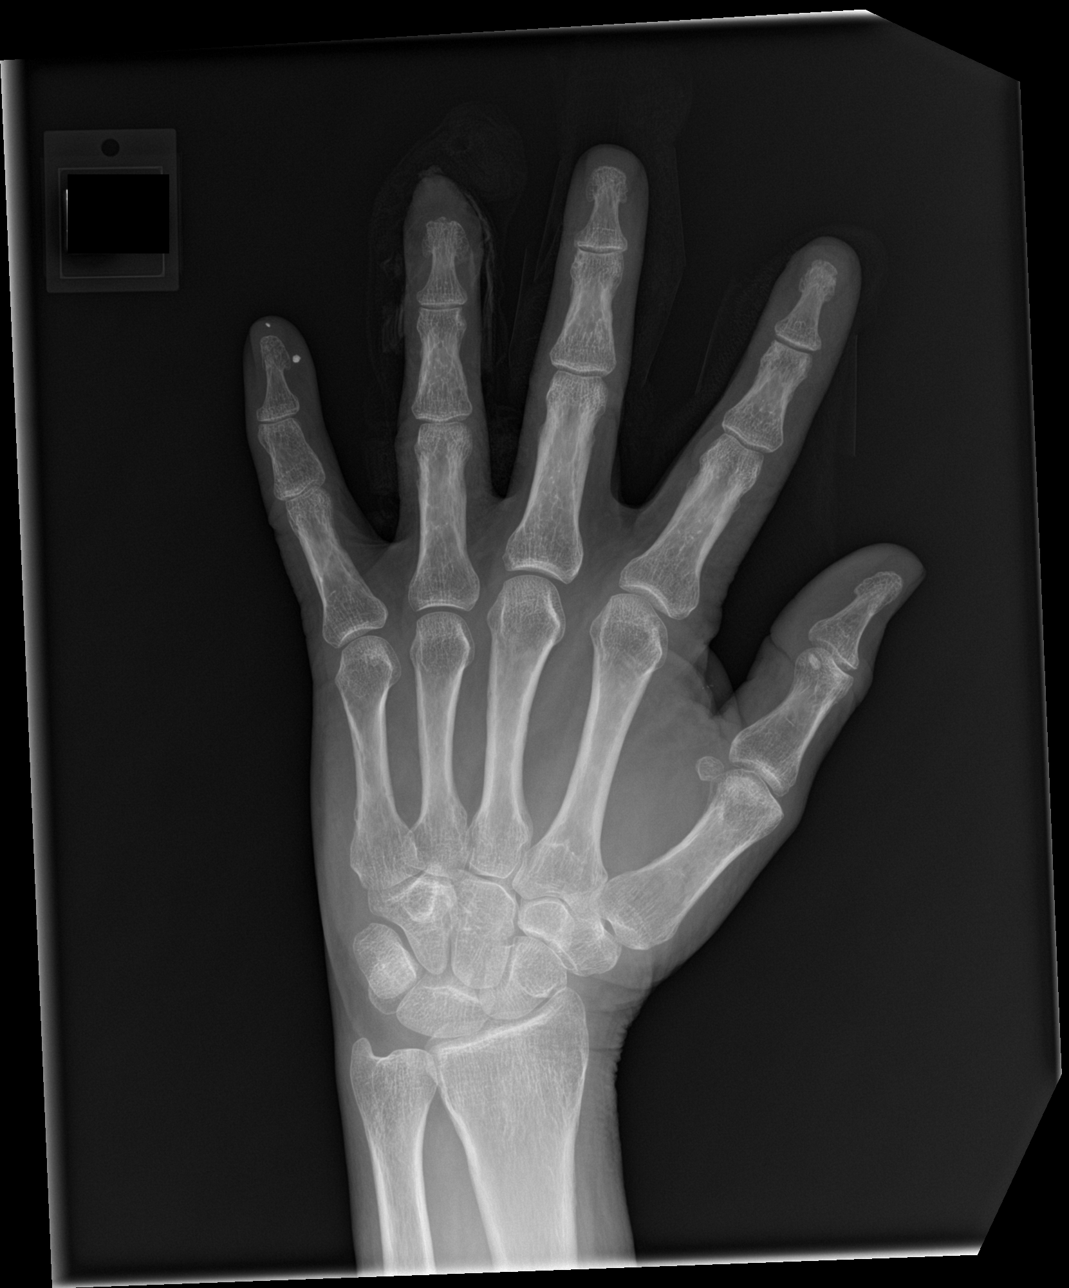

[hand obl]
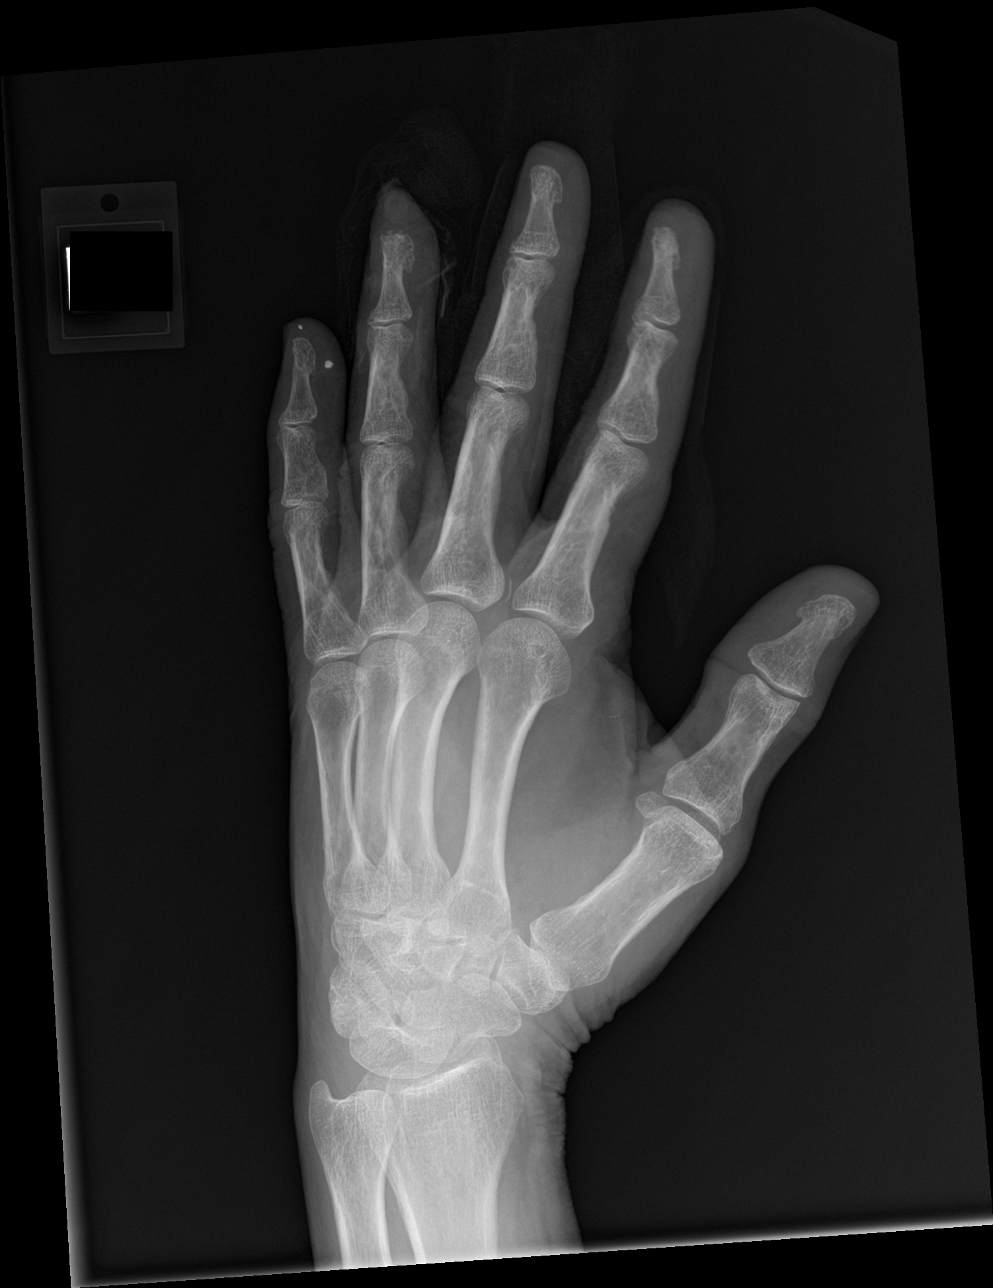

[hand lat]
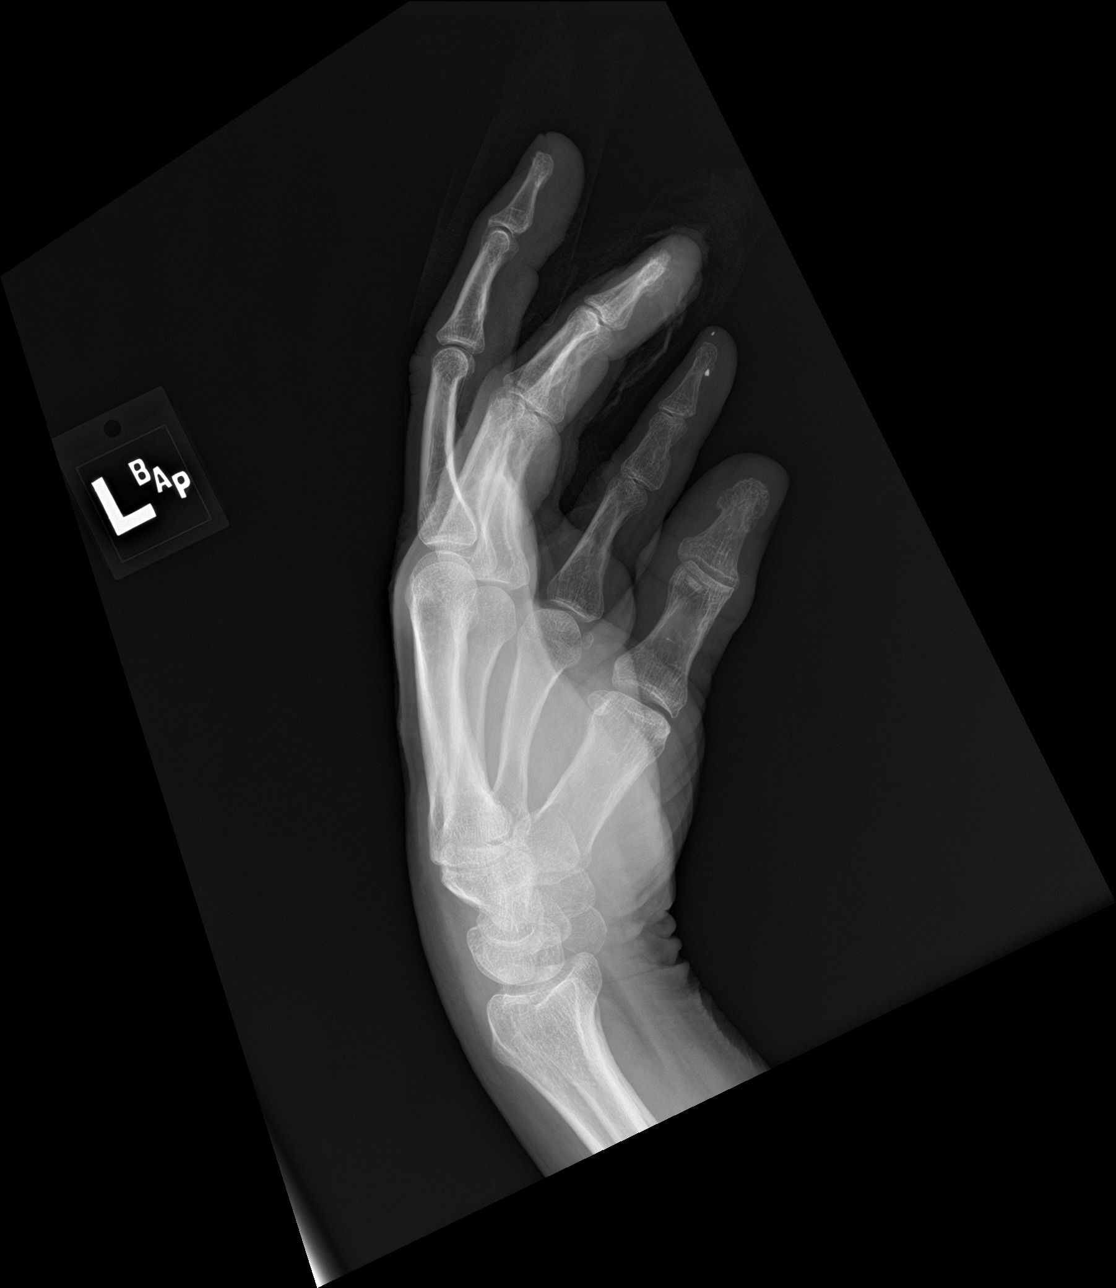

[3 of 3 positions shown; findings below may reference images not displayed]

FINDINGS: Bandaging material overlies the second through fourth digits.
Underlying soft tissue irregularity consistent with laceration, most
notable at the fourth digit. No radiopaque foreign body. Tuft total
irregularity at the fourth distal phalanx similar to previous exam,
consistent with a chronic finding. No acute fracture or dislocation.
Retained metallic densities within the left fifth digit noted,
stable.
IMPRESSION: 1. Soft tissue injury/laceration at the distal aspect of the second
through fourth digits, most severe at the fourth digit. No
radiopaque foreign body.
2. No acute fracture or dislocation.

## 2019-10-03 DIAGNOSIS — Z23 Encounter for immunization: Secondary | ICD-10-CM | POA: Diagnosis not present

## 2019-11-08 DIAGNOSIS — H40033 Anatomical narrow angle, bilateral: Secondary | ICD-10-CM | POA: Diagnosis not present

## 2019-11-08 DIAGNOSIS — H2513 Age-related nuclear cataract, bilateral: Secondary | ICD-10-CM | POA: Diagnosis not present

## 2020-01-16 DIAGNOSIS — Z23 Encounter for immunization: Secondary | ICD-10-CM | POA: Diagnosis not present

## 2020-02-09 ENCOUNTER — Encounter: Payer: Self-pay | Admitting: Family Medicine

## 2020-02-09 ENCOUNTER — Other Ambulatory Visit: Payer: Self-pay

## 2020-02-09 ENCOUNTER — Ambulatory Visit (INDEPENDENT_AMBULATORY_CARE_PROVIDER_SITE_OTHER): Payer: Medicare Other | Admitting: Family Medicine

## 2020-02-09 VITALS — BP 142/80 | HR 58 | Temp 97.6°F | Ht 67.0 in | Wt 167.0 lb

## 2020-02-09 DIAGNOSIS — J301 Allergic rhinitis due to pollen: Secondary | ICD-10-CM | POA: Diagnosis not present

## 2020-02-09 DIAGNOSIS — Z789 Other specified health status: Secondary | ICD-10-CM | POA: Diagnosis not present

## 2020-02-09 DIAGNOSIS — I10 Essential (primary) hypertension: Secondary | ICD-10-CM | POA: Diagnosis not present

## 2020-02-09 DIAGNOSIS — Z0000 Encounter for general adult medical examination without abnormal findings: Secondary | ICD-10-CM | POA: Diagnosis not present

## 2020-02-09 DIAGNOSIS — E559 Vitamin D deficiency, unspecified: Secondary | ICD-10-CM

## 2020-02-09 DIAGNOSIS — E782 Mixed hyperlipidemia: Secondary | ICD-10-CM

## 2020-02-09 DIAGNOSIS — Z0001 Encounter for general adult medical examination with abnormal findings: Secondary | ICD-10-CM | POA: Diagnosis not present

## 2020-02-09 DIAGNOSIS — Z125 Encounter for screening for malignant neoplasm of prostate: Secondary | ICD-10-CM | POA: Diagnosis not present

## 2020-02-09 LAB — LIPID PANEL

## 2020-02-09 MED ORDER — AMLODIPINE BESYLATE 10 MG PO TABS: 10.0000 mg | ORAL_TABLET | Freq: Every day | ORAL | 3 refills | Status: DC

## 2020-02-09 MED ORDER — FEXOFENADINE-PSEUDOEPHED ER 180-240 MG PO TB24: 1.0000 | ORAL_TABLET | Freq: Every evening | ORAL | 11 refills | Status: DC

## 2020-02-09 MED ORDER — LOSARTAN POTASSIUM-HCTZ 100-25 MG PO TABS: 1.0000 | ORAL_TABLET | Freq: Every day | ORAL | 3 refills | Status: DC

## 2020-02-09 MED ORDER — OMEGA-3 FATTY ACIDS 1000 MG PO CAPS: 2.0000 g | ORAL_CAPSULE | Freq: Every day | ORAL | Status: AC

## 2020-02-09 NOTE — Progress Notes (Signed)
Subjective:  Patient ID: Cesar West, male    DOB: December 04, 1955  Age: 64 y.o. MRN: [id number]  CC: Annual Exam   HPI Cesar West presents for Annual exam as well as follow-up of hypertension. Patient has no history of headache chest pain or shortness of breath or recent cough. Patient also denies symptoms of TIA such as focal numbness or weakness. Patient denies side effects from medication. States taking it regularly.   History Cesar West has a past medical history of Adenomatous colon polyp (7 /22/ 2010), Allergy, Cancer ([place]), Diverticulosis of colon (11/12/2008), GERD (gastroesophageal reflux disease), Hearing loss, Heart murmur, Hyperlipidemia, Hypertension, Internal hemorrhoid (11/12/2008), and Vitamin D deficiency.   Cesar West has a past surgical history that includes Colonoscopy; Polypectomy; and lump removed from right axilla area.   His family history includes Colon polyps in his brother; Heart disease in his father; Hypertension in his brother, father, and mother.Cesar West reports that Cesar West has never smoked. Cesar West has never used smokeless tobacco. Cesar West reports that Cesar West does not drink alcohol and does not use drugs.  Current Outpatient Medications on File Prior to Visit  Medication Sig Dispense Refill  . aspirin EC 81 MG tablet Take 81 mg by mouth daily.    Marland Kitchen b complex vitamins capsule Take 1 capsule by mouth daily.    . cholecalciferol (VITAMIN D) 1000 UNITS tablet Take 2,000 Units by mouth daily.     No current facility-administered medications on file prior to visit.    ROS Review of Systems  Constitutional: Negative for activity change, fatigue and unexpected weight change.  HENT: Positive for congestion and rhinorrhea. Negative for ear pain, hearing loss, postnasal drip and trouble swallowing.   Eyes: Negative for pain and visual disturbance.  Respiratory: Negative for cough, chest tightness and shortness of breath.   Cardiovascular: Negative for chest pain, palpitations and leg swelling.    Gastrointestinal: Negative for abdominal distention, abdominal pain, blood in stool, constipation, diarrhea, nausea and vomiting.  Endocrine: Negative for cold intolerance, heat intolerance and polydipsia.  Genitourinary: Negative for difficulty urinating, dysuria, flank pain, frequency and urgency.  Musculoskeletal: Negative for arthralgias and joint swelling.  Skin: Negative for color change, rash and wound.  Neurological: Negative for dizziness, syncope, speech difficulty, weakness, light-headedness, numbness and headaches.  Hematological: Does not bruise/bleed easily.  Psychiatric/Behavioral: Negative for confusion, decreased concentration, dysphoric mood and sleep disturbance. The patient is not nervous/anxious.     Objective:  BP (!) 142/80   Pulse (!) 58   Temp 97.6 F (36.4 C) (Temporal)   Ht 5' 7"  (1.702 m)   Wt 167 lb (75.8 kg)   BMI 26.16 kg/m   BP Readings from Last 3 Encounters:  02/09/20 (!) 142/80  03/26/19 130/82  02/06/19 129/72    Wt Readings from Last 3 Encounters:  02/09/20 167 lb (75.8 kg)  03/26/19 170 lb (77.1 kg)  03/12/19 170 lb (77.1 kg)     Physical Exam Constitutional:      Appearance: Cesar West is well-developed.  HENT:     Head: Normocephalic and atraumatic.  Eyes:     Pupils: Pupils are equal, round, and reactive to light.  Neck:     Thyroid: No thyromegaly.     Trachea: No tracheal deviation.  Cardiovascular:     Rate and Rhythm: Normal rate and regular rhythm.     Heart sounds: Normal heart sounds. No murmur heard.  No friction rub. No gallop.   Pulmonary:     Breath sounds: Normal breath sounds.  No wheezing or rales.  Abdominal:     General: Bowel sounds are normal. There is no distension.     Palpations: Abdomen is soft. There is no mass.     Tenderness: There is no abdominal tenderness.     Hernia: There is no hernia in the left inguinal area.  Genitourinary:    Penis: Normal.      Testes: Normal.  Musculoskeletal:         General: Normal range of motion.     Cervical back: Normal range of motion.  Lymphadenopathy:     Cervical: No cervical adenopathy.  Skin:    General: Skin is warm and dry.  Neurological:     Mental Status: Cesar West is alert and oriented to person, place, and time.       Assessment & Plan:   Cesar West was seen today for annual exam.  Diagnoses and all orders for this visit:  Vitamin D deficiency -     CBC with Differential/Platelet -     CMP14+EGFR -     VITAMIN D 25 Hydroxy (Vit-D Deficiency, Fractures) -     Urinalysis  Screening for prostate cancer -     CBC with Differential/Platelet -     CMP14+EGFR -     PSA Total (Reflex To Free) -     Urinalysis  Annual physical exam -     CBC with Differential/Platelet -     CMP14+EGFR -     Urinalysis  Primary hypertension -     CBC with Differential/Platelet -     CMP14+EGFR -     Urinalysis -     amLODipine (NORVASC) 10 MG tablet; Take 1 tablet (10 mg total) by mouth daily. -     losartan-hydrochlorothiazide (HYZAAR) 100-25 MG tablet; Take 1 tablet by mouth daily.  Mixed hyperlipidemia -     CBC with Differential/Platelet -     CMP14+EGFR -     Lipid panel -     Urinalysis -     fish oil-omega-3 fatty acids 1000 MG capsule; Take 2 capsules (2 g total) by mouth daily.  Seasonal allergic rhinitis due to pollen -     fexofenadine-pseudoephedrine (ALLEGRA-D 24) 180-240 MG 24 hr tablet; Take 1 tablet by mouth every evening. For allergy and congestion  Statin intolerance -     fish oil-omega-3 fatty acids 1000 MG capsule; Take 2 capsules (2 g total) by mouth daily.   Allergies as of 02/09/2020      Reactions   Statins    Caused laziness      Medication List       Accurate as of February 09, 2020  9:07 PM. If you have any questions, ask your nurse or doctor.        amLODipine 10 MG tablet Commonly known as: NORVASC Take 1 tablet (10 mg total) by mouth daily.   aspirin EC 81 MG tablet Take 81 mg by mouth daily.     b complex vitamins capsule Take 1 capsule by mouth daily.   cholecalciferol 1000 units tablet Commonly known as: VITAMIN D Take 2,000 Units by mouth daily.   fexofenadine-pseudoephedrine 180-240 MG 24 hr tablet Commonly known as: ALLEGRA-D 24 Take 1 tablet by mouth every evening. For allergy and congestion   fish oil-omega-3 fatty acids 1000 MG capsule Take 2 capsules (2 g total) by mouth daily.   losartan-hydrochlorothiazide 100-25 MG tablet Commonly known as: HYZAAR Take 1 tablet by mouth daily.  Meds ordered this encounter  Medications  . amLODipine (NORVASC) 10 MG tablet    Sig: Take 1 tablet (10 mg total) by mouth daily.    Dispense:  90 tablet    Refill:  3  . losartan-hydrochlorothiazide (HYZAAR) 100-25 MG tablet    Sig: Take 1 tablet by mouth daily.    Dispense:  90 tablet    Refill:  3  . fexofenadine-pseudoephedrine (ALLEGRA-D 24) 180-240 MG 24 hr tablet    Sig: Take 1 tablet by mouth every evening. For allergy and congestion    Dispense:  30 tablet    Refill:  11  . fish oil-omega-3 fatty acids 1000 MG capsule    Sig: Take 2 capsules (2 g total) by mouth daily.      Follow-up: Return in about 1 year (around 02/08/2021) for [facility] physical.  Claretta Fraise, M.D.

## 2020-02-10 LAB — CBC WITH DIFFERENTIAL/PLATELET
Basophils Absolute: 0.1 10*3/uL (ref 0.0–0.2)
Basos: 1 %
EOS (ABSOLUTE): 0.1 10*3/uL (ref 0.0–0.4)
Eos: 2 %
Hematocrit: 45 % (ref 37.5–51.0)
Hemoglobin: 15.5 g/dL (ref 13.0–17.7)
Immature Grans (Abs): 0 10*3/uL (ref 0.0–0.1)
Immature Granulocytes: 0 %
Lymphocytes Absolute: 1.9 10*3/uL (ref 0.7–3.1)
Lymphs: 28 %
MCH: 29.7 pg (ref 26.6–33.0)
MCHC: 34.4 g/dL (ref 31.5–35.7)
MCV: 86 fL (ref 79–97)
Monocytes Absolute: 0.7 10*3/uL (ref 0.1–0.9)
Monocytes: 11 %
Neutrophils Absolute: 3.9 10*3/uL (ref 1.4–7.0)
Neutrophils: 58 %
Platelets: 241 10*3/uL (ref 150–450)
RBC: 5.22 x10E6/uL (ref 4.14–5.80)
RDW: 12 % (ref 11.6–15.4)
WBC: 6.9 10*3/uL (ref 3.4–10.8)

## 2020-02-10 LAB — CMP14+EGFR
ALT: 29 IU/L (ref 0–44)
AST: 17 IU/L (ref 0–40)
Albumin/Globulin Ratio: 2 (ref 1.2–2.2)
Albumin: 4.6 g/dL (ref 3.8–4.8)
Alkaline Phosphatase: 97 IU/L (ref 44–121)
BUN/Creatinine Ratio: 13 (ref 10–24)
BUN: 13 mg/dL (ref 8–27)
Bilirubin Total: 1.4 mg/dL — ABNORMAL HIGH (ref 0.0–1.2)
CO2: 22 mmol/L (ref 20–29)
Calcium: 9.6 mg/dL (ref 8.6–10.2)
Chloride: 102 mmol/L (ref 96–106)
Creatinine, Ser: 1.03 mg/dL (ref 0.76–1.27)
GFR calc Af Amer: 87 mL/min/{1.73_m2} (ref >59)
GFR calc non Af Amer: 75 mL/min/{1.73_m2} (ref >59)
Globulin, Total: 2.3 g/dL (ref 1.5–4.5)
Glucose: 111 mg/dL — ABNORMAL HIGH (ref 65–99)
Potassium: 4.1 mmol/L (ref 3.5–5.2)
Sodium: 141 mmol/L (ref 134–144)
Total Protein: 6.9 g/dL (ref 6.0–8.5)

## 2020-02-10 LAB — LIPID PANEL
Chol/HDL Ratio: 3.8 ratio (ref 0.0–5.0)
Cholesterol, Total: 175 mg/dL (ref 100–199)
HDL: 46 mg/dL (ref >39)
LDL Chol Calc (NIH): 116 mg/dL — ABNORMAL HIGH (ref 0–99)
Triglycerides: 68 mg/dL (ref 0–149)
VLDL Cholesterol Cal: 13 mg/dL (ref 5–40)

## 2020-02-10 LAB — VITAMIN D 25 HYDROXY (VIT D DEFICIENCY, FRACTURES): Vit D, 25-Hydroxy: 51.5 ng/mL (ref 30.0–100.0)

## 2020-02-10 LAB — PSA TOTAL (REFLEX TO FREE): Prostate Specific Ag, Serum: 2.4 ng/mL (ref 0.0–4.0)

## 2020-02-10 NOTE — Progress Notes (Signed)
Hello Timmey,  Your lab result is normal and/or stable.Some minor variations that are not significant are commonly marked abnormal, but do not represent any medical problem for you.  Best regards, Wilkin Lippy, M.D.

## 2020-04-20 ENCOUNTER — Other Ambulatory Visit: Payer: Self-pay | Admitting: Family Medicine

## 2020-04-20 DIAGNOSIS — I10 Essential (primary) hypertension: Secondary | ICD-10-CM

## 2020-04-21 MED ORDER — LOSARTAN POTASSIUM 100 MG PO TABS: ORAL_TABLET | ORAL | 0 refills | Status: DC

## 2020-04-21 MED ORDER — HYDROCHLOROTHIAZIDE 25 MG PO TABS: 25.0000 mg | ORAL_TABLET | Freq: Every day | ORAL | 0 refills | Status: DC

## 2020-04-21 NOTE — Addendum Note (Signed)
Addended by: Julious Payer D on: 04/21/2020 09:27 AM   Modules accepted: Orders

## 2020-04-21 NOTE — Telephone Encounter (Signed)
Refill failed. resent °

## 2020-04-30 DIAGNOSIS — Z23 Encounter for immunization: Secondary | ICD-10-CM | POA: Diagnosis not present

## 2020-07-29 ENCOUNTER — Other Ambulatory Visit: Payer: Self-pay | Admitting: Family Medicine

## 2020-07-29 DIAGNOSIS — I10 Essential (primary) hypertension: Secondary | ICD-10-CM

## 2021-02-10 ENCOUNTER — Telehealth: Payer: Self-pay | Admitting: Family Medicine

## 2021-02-10 NOTE — Telephone Encounter (Signed)
Left message for patient to call back and schedule Medicare Annual Wellness Visit    Last AWV 02/09/20  please schedule at anytime with health coach  This should be a 45 minute visit.

## 2021-03-03 ENCOUNTER — Ambulatory Visit (INDEPENDENT_AMBULATORY_CARE_PROVIDER_SITE_OTHER): Payer: Medicare Other | Admitting: Family Medicine

## 2021-03-03 ENCOUNTER — Other Ambulatory Visit: Payer: Self-pay

## 2021-03-03 ENCOUNTER — Encounter: Payer: Self-pay | Admitting: Family Medicine

## 2021-03-03 VITALS — BP 141/72 | HR 68 | Temp 97.7°F | Ht 67.0 in | Wt 167.8 lb

## 2021-03-03 DIAGNOSIS — E559 Vitamin D deficiency, unspecified: Secondary | ICD-10-CM

## 2021-03-03 DIAGNOSIS — E782 Mixed hyperlipidemia: Secondary | ICD-10-CM | POA: Diagnosis not present

## 2021-03-03 DIAGNOSIS — I10 Essential (primary) hypertension: Secondary | ICD-10-CM

## 2021-03-03 DIAGNOSIS — Z125 Encounter for screening for malignant neoplasm of prostate: Secondary | ICD-10-CM | POA: Diagnosis not present

## 2021-03-03 DIAGNOSIS — J301 Allergic rhinitis due to pollen: Secondary | ICD-10-CM

## 2021-03-03 MED ORDER — LOSARTAN POTASSIUM 100 MG PO TABS: ORAL_TABLET | ORAL | 3 refills | Status: DC

## 2021-03-03 MED ORDER — HYDROCHLOROTHIAZIDE 25 MG PO TABS: 25.0000 mg | ORAL_TABLET | Freq: Every day | ORAL | 3 refills | Status: DC

## 2021-03-03 MED ORDER — AMLODIPINE BESYLATE 10 MG PO TABS: 10.0000 mg | ORAL_TABLET | Freq: Every day | ORAL | 3 refills | Status: DC

## 2021-03-03 MED ORDER — FEXOFENADINE-PSEUDOEPHED ER 180-240 MG PO TB24: 1.0000 | ORAL_TABLET | Freq: Every evening | ORAL | 3 refills | Status: DC

## 2021-03-03 NOTE — Patient Instructions (Addendum)
Blood pressure checks at home while relaxing should be under 130/80   Back Exercises The following exercises strengthen the muscles that help to support the trunk (torso) and back. They also help to keep the lower back flexible. Doing these exercises can help to prevent or lessen existing low back pain. If you have back pain or discomfort, try doing these exercises 2-3 times each day or as told by your health care provider. As your pain improves, do them once each day, but increase the number of times that you repeat the steps for each exercise (do more repetitions). To prevent the recurrence of back pain, continue to do these exercises once each day or as told by your health care provider. Do exercises exactly as told by your health care provider and adjust them as directed. It is normal to feel mild stretching, pulling, tightness, or discomfort as you do these exercises, but you should stop right away if you feel sudden pain or your pain gets worse. Exercises Single knee to chest Repeat these steps 3-5 times for each leg: Lie on your back on a firm bed or the floor with your legs extended. Bring one knee to your chest. Your other leg should stay extended and in contact with the floor. Hold your knee in place by grabbing your knee or thigh with both hands and hold. Pull on your knee until you feel a gentle stretch in your lower back or buttocks. Hold the stretch for 10-30 seconds. Slowly release and straighten your leg.  Pelvic tilt Repeat these steps 5-10 times: Lie on your back on a firm bed or the floor with your legs extended. Bend your knees so they are pointing toward the ceiling and your feet are flat on the floor. Tighten your lower abdominal muscles to press your lower back against the floor. This motion will tilt your pelvis so your tailbone points up toward the ceiling instead of pointing to your feet or the floor. With gentle tension and even breathing, hold this position for  5-10 seconds.  Cat-cow Repeat these steps until your lower back becomes more flexible: Get into a hands-and-knees position on a firm bed or the floor. Keep your hands under your shoulders, and keep your knees under your hips. You may place padding under your knees for comfort. Let your head hang down toward your chest. Contract your abdominal muscles and point your tailbone toward the floor so your lower back becomes rounded like the back of a cat. Hold this position for 5 seconds. Slowly lift your head, let your abdominal muscles relax, and point your tailbone up toward the ceiling so your back forms a sagging arch like the back of a cow. Hold this position for 5 seconds.  Press-ups Repeat these steps 5-10 times: Lie on your abdomen (face-down) on a firm bed or the floor. Place your palms near your head, about shoulder-width apart. Keeping your back as relaxed as possible and keeping your hips on the floor, slowly straighten your arms to raise the top half of your body and lift your shoulders. Do not use your back muscles to raise your upper torso. You may adjust the placement of your hands to make yourself more comfortable. Hold this position for 5 seconds while you keep your back relaxed. Slowly return to lying flat on the floor.  Bridges Repeat these steps 10 times: Lie on your back on a firm bed or the floor. Bend your knees so they are pointing toward the ceiling  and your feet are flat on the floor. Your arms should be flat at your sides, next to your body. Tighten your buttocks muscles and lift your buttocks off the floor until your waist is at almost the same height as your knees. You should feel the muscles working in your buttocks and the back of your thighs. If you do not feel these muscles, slide your feet 1-2 inches (2.5-5 cm) farther away from your buttocks. Hold this position for 3-5 seconds. Slowly lower your hips to the starting position, and allow your buttocks muscles to  relax completely. If this exercise is too easy, try doing it with your arms crossed over your chest. Abdominal crunches Repeat these steps 5-10 times: Lie on your back on a firm bed or the floor with your legs extended. Bend your knees so they are pointing toward the ceiling and your feet are flat on the floor. Cross your arms over your chest. Tip your chin slightly toward your chest without bending your neck. Tighten your abdominal muscles and slowly raise your torso high enough to lift your shoulder blades a tiny bit off the floor. Avoid raising your torso higher than that because it can put too much stress on your lower back and does not help to strengthen your abdominal muscles. Slowly return to your starting position.  Back lifts Repeat these steps 5-10 times: Lie on your abdomen (face-down) with your arms at your sides, and rest your forehead on the floor. Tighten the muscles in your legs and your buttocks. Slowly lift your chest off the floor while you keep your hips pressed to the floor. Keep the back of your head in line with the curve in your back. Your eyes should be looking at the floor. Hold this position for 3-5 seconds. Slowly return to your starting position.  Contact a health care provider if: Your back pain or discomfort gets much worse when you do an exercise. Your worsening back pain or discomfort does not lessen within 2 hours after you exercise. If you have any of these problems, stop doing these exercises right away. Do not do them again unless your health care provider says that you can. Get help right away if: You develop sudden, severe back pain. If this happens, stop doing the exercises right away. Do not do them again unless your health care provider says that you can. This information is not intended to replace advice given to you by your health care provider. Make sure you discuss any questions you have with your health care provider. Document Revised:  10/05/2020 Document Reviewed: 06/23/2020 Elsevier Patient Education  [place].

## 2021-03-03 NOTE — Progress Notes (Signed)
Subjective:  Patient ID: Cesar West, male    DOB: 1955-04-25  Age: 65 y.o. MRN: [id number]  CC: Medical Management of Chronic Issues   HPI Cesar West presents for  follow-up of hypertension. Patient has no history of headache chest pain or shortness of breath or recent cough. Patient also denies symptoms of TIA such as focal numbness or weakness. Patient denies side effects from medication. States taking it regularly.  Back pain onset over a month ago. NKI. Lifting wood might have contributed. At onset pain was9-9/10. Now down to 1-2/10.    History Cesar West has a past medical history of Adenomatous colon polyp (7 /22/ 2010), Allergy, Cancer ([place]), Diverticulosis of colon (11/12/2008), GERD (gastroesophageal reflux disease), Hearing loss, Heart murmur, Hyperlipidemia, Hypertension, Internal hemorrhoid (11/12/2008), and Vitamin D deficiency.   He has a past surgical history that includes Colonoscopy; Polypectomy; and lump removed from right axilla area.   His family history includes Colon polyps in his brother; Heart disease in his father; Hypertension in his brother, father, and mother.He reports that he has never smoked. He has never used smokeless tobacco. He reports that he does not drink alcohol and does not use drugs.  Current Outpatient Medications on File Prior to Visit  Medication Sig Dispense Refill   aspirin EC 81 MG tablet Take 81 mg by mouth daily.     b complex vitamins capsule Take 1 capsule by mouth daily.     cholecalciferol (VITAMIN D) 1000 UNITS tablet Take 2,000 Units by mouth daily.     fish oil-omega-3 fatty acids 1000 MG capsule Take 2 capsules (2 g total) by mouth daily.     No current facility-administered medications on file prior to visit.    ROS Review of Systems  Constitutional:  Negative for fever.  Respiratory:  Negative for shortness of breath.   Cardiovascular:  Negative for chest pain.  Musculoskeletal:  Negative for arthralgias.  Skin:  Negative  for rash.   Objective:  BP (!) 141/72   Pulse 68   Temp 97.7 F (36.5 C)   Ht 5' 7"  (1.702 m)   Wt 167 lb 12.8 oz (76.1 kg)   SpO2 98%   BMI 26.28 kg/m   BP Readings from Last 3 Encounters:  03/03/21 (!) 141/72  02/09/20 (!) 142/80  03/26/19 130/82    Wt Readings from Last 3 Encounters:  03/03/21 167 lb 12.8 oz (76.1 kg)  02/09/20 167 lb (75.8 kg)  03/26/19 170 lb (77.1 kg)     Physical Exam Vitals reviewed.  Constitutional:      Appearance: He is well-developed.  HENT:     Head: Normocephalic and atraumatic.     Right Ear: External ear normal.     Left Ear: External ear normal.     Mouth/Throat:     Pharynx: No oropharyngeal exudate or posterior oropharyngeal erythema.  Eyes:     Pupils: Pupils are equal, round, and reactive to light.  Cardiovascular:     Rate and Rhythm: Normal rate and regular rhythm.     Heart sounds: Murmur (2/6 systolic - unchanged) heard.  Pulmonary:     Effort: No respiratory distress.     Breath sounds: Normal breath sounds.  Musculoskeletal:     Cervical back: Normal range of motion and neck supple.  Skin:    General: Skin is warm and dry.  Neurological:     Mental Status: He is alert and oriented to person, place, and time.  Assessment & Plan:   [name] was seen today for medical management of chronic issues.  Diagnoses and all orders for this visit:  Vitamin D deficiency -     VITAMIN D 25 Hydroxy (Vit-D Deficiency, Fractures)  Screening for prostate cancer -     PSA, total and free  Mixed hyperlipidemia -     Lipid panel  Primary hypertension -     CBC with Differential/Platelet -     CMP14+EGFR -     amLODipine (NORVASC) 10 MG tablet; Take 1 tablet (10 mg total) by mouth daily. -     losartan (COZAAR) 100 MG tablet; TAKE 1 TABLET DAILY  Seasonal allergic rhinitis due to pollen -     fexofenadine-pseudoephedrine (ALLEGRA-D 24) 180-240 MG 24 hr tablet; Take 1 tablet by mouth every evening. For allergy and  congestion  Other orders -     hydrochlorothiazide (HYDRODIURIL) 25 MG tablet; Take 1 tablet (25 mg total) by mouth daily.  Allergies as of 03/03/2021       Reactions   Statins    Caused laziness        Medication List        Accurate as of March 03, 2021  8:31 AM. If you have any questions, ask your nurse or doctor.          amLODipine 10 MG tablet Commonly known as: NORVASC Take 1 tablet (10 mg total) by mouth daily.   aspirin EC 81 MG tablet Take 81 mg by mouth daily.   b complex vitamins capsule Take 1 capsule by mouth daily.   cholecalciferol 1000 units tablet Commonly known as: VITAMIN D Take 2,000 Units by mouth daily.   fexofenadine-pseudoephedrine 180-240 MG 24 hr tablet Commonly known as: ALLEGRA-D 24 Take 1 tablet by mouth every evening. For allergy and congestion   fish oil-omega-3 fatty acids 1000 MG capsule Take 2 capsules (2 g total) by mouth daily.   hydrochlorothiazide 25 MG tablet Commonly known as: HYDRODIURIL Take 1 tablet (25 mg total) by mouth daily. What changed: additional instructions Changed by: Claretta Fraise, MD   losartan 100 MG tablet Commonly known as: COZAAR TAKE 1 TABLET DAILY What changed: additional instructions Changed by: Claretta Fraise, MD        Meds ordered this encounter  Medications   amLODipine (NORVASC) 10 MG tablet    Sig: Take 1 tablet (10 mg total) by mouth daily.    Dispense:  90 tablet    Refill:  3   fexofenadine-pseudoephedrine (ALLEGRA-D 24) 180-240 MG 24 hr tablet    Sig: Take 1 tablet by mouth every evening. For allergy and congestion    Dispense:  90 tablet    Refill:  3   hydrochlorothiazide (HYDRODIURIL) 25 MG tablet    Sig: Take 1 tablet (25 mg total) by mouth daily.    Dispense:  90 tablet    Refill:  3   losartan (COZAAR) 100 MG tablet    Sig: TAKE 1 TABLET DAILY    Dispense:  90 tablet    Refill:  3      Follow-up: Return in about 1 year (around 03/03/2022) for [facility]  physical.  Claretta Fraise, M.D.

## 2021-03-04 LAB — CBC WITH DIFFERENTIAL/PLATELET
Basophils Absolute: 0.1 10*3/uL (ref 0.0–0.2)
Basos: 1 %
EOS (ABSOLUTE): 0.2 10*3/uL (ref 0.0–0.4)
Eos: 2 %
Hematocrit: 46.1 % (ref 37.5–51.0)
Hemoglobin: 16 g/dL (ref 13.0–17.7)
Immature Grans (Abs): 0 10*3/uL (ref 0.0–0.1)
Immature Granulocytes: 1 %
Lymphocytes Absolute: 2 10*3/uL (ref 0.7–3.1)
Lymphs: 25 %
MCH: 29.6 pg (ref 26.6–33.0)
MCHC: 34.7 g/dL (ref 31.5–35.7)
MCV: 85 fL (ref 79–97)
Monocytes Absolute: 0.9 10*3/uL (ref 0.1–0.9)
Monocytes: 11 %
Neutrophils Absolute: 4.9 10*3/uL (ref 1.4–7.0)
Neutrophils: 60 %
Platelets: 228 10*3/uL (ref 150–450)
RBC: 5.4 x10E6/uL (ref 4.14–5.80)
RDW: 11.7 % (ref 11.6–15.4)
WBC: 8.1 10*3/uL (ref 3.4–10.8)

## 2021-03-04 LAB — CMP14+EGFR
ALT: 30 IU/L (ref 0–44)
AST: 26 IU/L (ref 0–40)
Albumin/Globulin Ratio: 1.9 (ref 1.2–2.2)
Albumin: 4.8 g/dL (ref 3.8–4.8)
Alkaline Phosphatase: 106 IU/L (ref 44–121)
BUN/Creatinine Ratio: 10 (ref 10–24)
BUN: 11 mg/dL (ref 8–27)
Bilirubin Total: 1.3 mg/dL — ABNORMAL HIGH (ref 0.0–1.2)
CO2: 26 mmol/L (ref 20–29)
Calcium: 10.2 mg/dL (ref 8.6–10.2)
Chloride: 99 mmol/L (ref 96–106)
Creatinine, Ser: 1.05 mg/dL (ref 0.76–1.27)
Globulin, Total: 2.5 g/dL (ref 1.5–4.5)
Glucose: 112 mg/dL — ABNORMAL HIGH (ref 70–99)
Potassium: 5.1 mmol/L (ref 3.5–5.2)
Sodium: 139 mmol/L (ref 134–144)
Total Protein: 7.3 g/dL (ref 6.0–8.5)
eGFR: 78 mL/min/{1.73_m2} (ref >59)

## 2021-03-04 LAB — LIPID PANEL
Chol/HDL Ratio: 3.7 ratio (ref 0.0–5.0)
Cholesterol, Total: 191 mg/dL (ref 100–199)
HDL: 51 mg/dL (ref >39)
LDL Chol Calc (NIH): 124 mg/dL — ABNORMAL HIGH (ref 0–99)
Triglycerides: 89 mg/dL (ref 0–149)
VLDL Cholesterol Cal: 16 mg/dL (ref 5–40)

## 2021-03-04 LAB — VITAMIN D 25 HYDROXY (VIT D DEFICIENCY, FRACTURES): Vit D, 25-Hydroxy: 84.5 ng/mL (ref 30.0–100.0)

## 2021-03-04 LAB — PSA, TOTAL AND FREE
PSA, Free Pct: 31 %
PSA, Free: 0.9 ng/mL
Prostate Specific Ag, Serum: 2.9 ng/mL (ref 0.0–4.0)

## 2021-03-08 ENCOUNTER — Other Ambulatory Visit: Payer: Self-pay | Admitting: Family Medicine

## 2021-03-08 DIAGNOSIS — I10 Essential (primary) hypertension: Secondary | ICD-10-CM

## 2021-03-08 NOTE — Progress Notes (Signed)
Hello Cesar West,  Your lab result is normal and/or stable.Some minor variations that are not significant are commonly marked abnormal, but do not represent any medical problem for you.  Best regards, Laconda Basich, M.D.

## 2021-04-04 ENCOUNTER — Ambulatory Visit (INDEPENDENT_AMBULATORY_CARE_PROVIDER_SITE_OTHER): Payer: Medicare Other

## 2021-04-04 VITALS — Ht 67.0 in | Wt 168.0 lb

## 2021-04-04 DIAGNOSIS — Z0000 Encounter for general adult medical examination without abnormal findings: Secondary | ICD-10-CM

## 2021-04-04 NOTE — Progress Notes (Signed)
Subjective:   Cesar West is a 65 y.o. male who presents for an Initial Medicare Annual Wellness Visit. Virtual Visit via Telephone Note  I connected with  Cesar West on 04/04/21 at  2:45 PM EST by telephone and verified that I am speaking with the correct person using two identifiers.  Location: Patient: Home Provider: [facility] Persons participating in the virtual visit: patient/Nurse Health Advisor   I discussed the limitations, risks, security and privacy concerns of performing an evaluation and management service by telephone and the availability of in person appointments. The patient expressed understanding and agreed to proceed.  Interactive audio and video telecommunications were attempted between this nurse and patient, however failed, due to patient having technical difficulties OR patient did not have access to video capability.  We continued and completed visit with audio only.  Some vital signs may be absent or patient reported.   Cesar Driver, LPN  Review of Systems     Cardiac Risk Factors include: advanced age (>76men, >71 women);hypertension;dyslipidemia;male gender;sedentary lifestyle     Objective:    Today's Vitals   04/04/21 1448  Weight: 168 lb (76.2 kg)  Height: 5\' 7"  (1.702 m)   Body mass index is 26.31 kg/m.  Advanced Directives 04/04/2021  Does Patient Have a Medical Advance Directive? Yes  Type of Advance Directive Living will    Current Medications (verified) Outpatient Encounter Medications as of 04/04/2021  Medication Sig   amLODipine (NORVASC) 10 MG tablet Take 1 tablet (10 mg total) by mouth daily.   aspirin EC 81 MG tablet Take 81 mg by mouth daily.   b complex vitamins capsule Take 1 capsule by mouth daily.   cholecalciferol (VITAMIN D) 1000 UNITS tablet Take 2,000 Units by mouth daily.   fexofenadine-pseudoephedrine (ALLEGRA-D 24) 180-240 MG 24 hr tablet Take 1 tablet by mouth every evening. For allergy and congestion   fish  oil-omega-3 fatty acids 1000 MG capsule Take 2 capsules (2 g total) by mouth daily.   hydrochlorothiazide (HYDRODIURIL) 25 MG tablet Take 1 tablet (25 mg total) by mouth daily.   losartan (COZAAR) 100 MG tablet TAKE 1 TABLET DAILY   losartan-hydrochlorothiazide (HYZAAR) 100-25 MG tablet TAKE 1 TABLET BY MOUTH EVERY DAY   No facility-administered encounter medications on file as of 04/04/2021.    Allergies (verified) Statins   History: Past Medical History:  Diagnosis Date   Adenomatous colon polyp 7 /22/ 2010   Allergy    mild   Cancer (HCC)    basal cell skin of the right triceps area   Diverticulosis of colon 11/12/2008   left colon   GERD (gastroesophageal reflux disease)    some if eats  late    Hearing loss    right ear   Heart murmur    Hyperlipidemia    Hypertension    Internal hemorrhoid 11/12/2008   Vitamin D deficiency    Past Surgical History:  Procedure Laterality Date   COLONOSCOPY     lump removed from right axilla area     POLYPECTOMY     Family History  Problem Relation Age of Onset   Hypertension Mother    Hypertension Father    Heart disease Father    Hypertension Brother    Colon polyps Brother    Colon cancer Neg Hx    Esophageal cancer Neg Hx    Rectal cancer Neg Hx    Stomach cancer Neg Hx    Social History   Socioeconomic History  Marital status: Single    Spouse name: Not on file   Number of children: Not on file   Years of education: Not on file   Highest education level: Not on file  Occupational History   Not on file  Tobacco Use   Smoking status: Never   Smokeless tobacco: Never  Substance and Sexual Activity   Alcohol use: No   Drug use: No   Sexual activity: Not on file  Other Topics Concern   Not on file  Social History Narrative   Not on file   Social Determinants of Health   Financial Resource Strain: Low Risk    Difficulty of Paying Living Expenses: Not very hard  Food Insecurity: No Food Insecurity   Worried  About Running Out of Food in the Last Year: Never true   Ran Out of Food in the Last Year: Never true  Transportation Needs: No Transportation Needs   Lack of Transportation (Medical): No   Lack of Transportation (Non-Medical): No  Physical Activity: Inactive   Days of Exercise per Week: 0 days   Minutes of Exercise per Session: 0 min  Stress: No Stress Concern Present   Feeling of Stress : Not at all  Social Connections: Moderately Isolated   Frequency of Communication with Friends and Family: More than three times a week   Frequency of Social Gatherings with Friends and Family: More than three times a week   Attends Religious Services: Never   Marine scientist or Organizations: No   Attends Music therapist: 1 to 4 times per year   Marital Status: Never married    Tobacco Counseling Counseling given: Not Answered   Clinical Intake:  Pre-visit preparation completed: Yes  Pain : No/denies pain     BMI - recorded: 26.31 Nutritional Status: BMI 25 -29 Overweight Nutritional Risks: None Diabetes: No  How often do you need to have someone help you when you read instructions, pamphlets, or other written materials from your doctor or pharmacy?: 1 - Never  Diabetic?No  Interpreter Needed?: No  Information entered by :: Cesar Anelisse Jacobson, LPN   Activities of Daily Living In your present state of health, do you have any difficulty performing the following activities: 04/04/2021  Hearing? Y  Vision? N  Difficulty concentrating or making decisions? Y  Comment memory issues.  Walking or climbing stairs? N  Dressing or bathing? N  Doing errands, shopping? N  Preparing Food and eating ? N  Using the Toilet? N  In the past six months, have you accidently leaked urine? N  Do you have problems with loss of bowel control? N  Managing your Medications? N  Managing your Finances? N  Housekeeping or managing your Housekeeping? N  Some recent data might be hidden     Patient Care Team: Claretta Fraise, MD as PCP - General (Family Medicine)  Indicate any recent Medical Services you may have received from other than [facility] providers in the past year (date may be approximate).     Assessment:   This is a routine wellness examination for Cesar West.  Hearing/Vision screen Hearing Screening - Comments:: Wears hearing aids. Vision Screening - Comments:: Glasses. [facility] [place], 03/2021.  Dietary issues and exercise activities discussed:     Goals Addressed             This Visit's Progress    Exercise 3x per week (30 min per time)       Increase exercise.  Depression Screen PHQ 2/9 Scores 04/04/2021 03/03/2021 03/03/2021 02/09/2020 02/06/2019 09/23/2018 02/04/2018  PHQ - 2 Score 0 1 0 0 0 0 1  PHQ- 9 Score 0 5 - - - 0 -    Fall Risk Fall Risk  04/04/2021 03/03/2021 02/09/2020 02/06/2019 09/23/2018  Falls in the past year? 0 0 0 0 0  Number falls in past yr: 0 - 0 - -  Injury with Fall? 0 - 0 - -  Risk for fall due to : Impaired vision - - - -  Follow up Falls prevention discussed - - - -    FALL RISK PREVENTION PERTAINING TO THE HOME:  Any stairs in or around the home? Yes  If so, are there any without handrails? No  Home free of loose throw rugs in walkways, pet beds, electrical cords, etc? Yes  Adequate lighting in your home to reduce risk of falls? Yes   ASSISTIVE DEVICES UTILIZED TO PREVENT FALLS:  Life alert? No  Use of a cane, walker or w/c? No  Grab bars in the bathroom? No  Shower chair or bench in shower? No  Elevated toilet seat or a handicapped toilet? No   TIMED UP AND GO:  Was the test performed? No .  Phone visit.   Cognitive Function:     6CIT Screen 04/04/2021  What Year? 0 points  What month? 0 points  What time? 0 points  Count back from 20 0 points  Months in reverse 0 points  Repeat phrase 2 points  Total Score 2    Immunizations Immunization History  Administered Date(s) Administered    Fluad Quad(high Dose 65+) 02/06/2019   Influenza Split 01/16/2013   Influenza Whole 04/04/2012   Influenza, High Dose Seasonal PF 01/16/2020, 12/31/2020   Influenza, Seasonal, Injecte, Preservative Fre 01/05/2014   Influenza,inj,Quad PF,6+ Mos 02/08/2015, 02/04/2018   Influenza-Unspecified 01/17/2016, 03/01/2017   Janssen (J&J) SARS-COV-2 Vaccination 10/03/2019   Moderna Sars-Covid-2 Vaccination 04/30/2020   Pneumococcal Conjugate-13 05/11/2014   Pneumococcal Polysaccharide-23 09/23/2018   Td 04/09/2007, 03/06/2011   Tdap 11/14/2017   Zoster Recombinat (Shingrix) 06/06/2018, 06/06/2018, 10/31/2018   Zoster, Live 07/20/2014    TDAP status: Up to date  Flu Vaccine status: Up to date  Pneumococcal vaccine status: Up to date  Covid-19 vaccine status: Completed vaccines  Qualifies for Shingles Vaccine? Yes   Zostavax completed Yes   Shingrix Completed?: Yes  Screening Tests Health Maintenance  Topic Date Due   COVID-19 Vaccine (3 - Booster for Janssen series) 06/25/2020   COLONOSCOPY (Pts 45-109yrs Insurance coverage will need to be confirmed)  03/25/2024   TETANUS/TDAP  11/15/2027   Pneumonia Vaccine 15+ Years old  Completed   INFLUENZA VACCINE  Completed   Hepatitis C Screening  Completed   Zoster Vaccines- Shingrix  Completed   HPV VACCINES  Aged Out    Health Maintenance  Health Maintenance Due  Topic Date Due   COVID-19 Vaccine (3 - Booster for Janssen series) 06/25/2020    Colorectal cancer screening: Type of screening: Colonoscopy. Completed 03/26/2019. Repeat every 5 years  Lung Cancer Screening: (Low Dose CT Chest recommended if Age 56-80 years, 30 pack-year currently smoking OR have quit w/in 15years.) does not qualify.    Additional Screening:  Hepatitis C Screening: does qualify; Completed 03/28/2013  Vision Screening: Recommended annual ophthalmology exams for early detection of glaucoma and other disorders of the eye. Is the patient up to date with  their annual eye exam?  Yes  Who is  the provider or what is the name of the office in which the patient attends annual eye exams? [facility] If pt is not established with a provider, would they like to be referred to a provider to establish care? No .   Dental Screening: Recommended annual dental exams for proper oral hygiene  Community Resource Referral / Chronic Care Management: CRR required this visit?  No   CCM required this visit?  No      Plan:     I have personally reviewed and noted the following in the patient's chart:   Medical and social history Use of alcohol, tobacco or illicit drugs  Current medications and supplements including opioid prescriptions. Patient is not currently taking opioid prescriptions. Functional ability and status Nutritional status Physical activity Advanced directives List of other physicians Hospitalizations, surgeries, and ER visits in previous 12 months Vitals Screenings to include cognitive, depression, and falls Referrals and appointments  In addition, I have reviewed and discussed with patient certain preventive protocols, quality metrics, and best practice recommendations. A written personalized care plan for preventive services as well as general preventive health recommendations were provided to patient.     Cesar Driver, LPN   [id number]   Nurse Notes: Pt up to date on all vaccines and health maintenance. 6CIT score of 0.

## 2021-04-04 NOTE — Patient Instructions (Signed)
Cesar West , Thank you for taking time to come for your Medicare Wellness Visit. I appreciate your ongoing commitment to your health goals. Please review the following plan we discussed and let me know if I can assist you in the future.   Screening recommendations/referrals: Colonoscopy: Done 03/26/2019 Repeat in 5 years  Recommended yearly ophthalmology/optometry visit for glaucoma screening and checkup Recommended yearly dental visit for hygiene and checkup  Vaccinations: Influenza vaccine: Done 12/31/2020 Repeat annually  Pneumococcal vaccine: Done 05/11/2014 and 09/23/2018  Tdap vaccine: Done 11/14/2017 Shingles vaccine: Done 06/21/2014, 06/06/2018 and 10/31/2018   Covid-19: Done 10/03/2019 and 04/30/2020  Advanced directives: Please bring a copy of your health care power of attorney and living will to the office to be added to your chart at your convenience.   Conditions/risks identified: Aim for 30 minutes of exercise or brisk walking each day, drink 6-8 glasses of water and eat lots of fruits and vegetables.   Next appointment: Follow up in one year for your annual wellness visit. 2023.  Preventive Care 44 Years and Older, Male  Preventive care refers to lifestyle choices and visits with your health care provider that can promote health and wellness. What does preventive care include? A yearly physical exam. This is also called an annual well check. Dental exams once or twice a year. Routine eye exams. Ask your health care provider how often you should have your eyes checked. Personal lifestyle choices, including: Daily care of your teeth and gums. Regular physical activity. Eating a healthy diet. Avoiding tobacco and drug use. Limiting alcohol use. Practicing safe sex. Taking low doses of aspirin every day. Taking vitamin and mineral supplements as recommended by your health care provider. What happens during an annual well check? The services and screenings done by your health care  provider during your annual well check will depend on your age, overall health, lifestyle risk factors, and family history of disease. Counseling  Your health care provider may ask you questions about your: Alcohol use. Tobacco use. Drug use. Emotional well-being. Home and relationship well-being. Sexual activity. Eating habits. History of falls. Memory and ability to understand (cognition). Work and work Statistician. Screening  You may have the following tests or measurements: Height, weight, and BMI. Blood pressure. Lipid and cholesterol levels. These may be checked every 5 years, or more frequently if you are over 83 years old. Skin check. Lung cancer screening. You may have this screening every year starting at age 67 if you have a 30-pack-year history of smoking and currently smoke or have quit within the past 15 years. Fecal occult blood test (FOBT) of the stool. You may have this test every year starting at age 67. Flexible sigmoidoscopy or colonoscopy. You may have a sigmoidoscopy every 5 years or a colonoscopy every 10 years starting at age 67. Prostate cancer screening. Recommendations will vary depending on your family history and other risks. Hepatitis C blood test. Hepatitis B blood test. Sexually transmitted disease (STD) testing. Diabetes screening. This is done by checking your blood sugar (glucose) after you have not eaten for a while (fasting). You may have this done every 1-3 years. Abdominal aortic aneurysm (AAA) screening. You may need this if you are a current or former smoker. Osteoporosis. You may be screened starting at age 67 if you are at high risk. Talk with your health care provider about your test results, treatment options, and if necessary, the need for more tests. Vaccines  Your health care provider may recommend  certain vaccines, such as: Influenza vaccine. This is recommended every year. Tetanus, diphtheria, and acellular pertussis (Tdap, Td)  vaccine. You may need a Td booster every 10 years. Zoster vaccine. You may need this after age 67. Pneumococcal 13-valent conjugate (PCV13) vaccine. One dose is recommended after age 67. Pneumococcal polysaccharide (PPSV23) vaccine. One dose is recommended after age 67. Talk to your health care provider about which screenings and vaccines you need and how often you need them. This information is not intended to replace advice given to you by your health care provider. Make sure you discuss any questions you have with your health care provider. Document Released: 05/07/2015 Document Revised: 12/29/2015 Document Reviewed: 02/09/2015 Elsevier Interactive Patient Education  2017 [place] Prevention in the Home Falls can cause injuries. They can happen to people of all ages. There are many things you can do to make your home safe and to help prevent falls. What can I do on the outside of my home? Regularly fix the edges of walkways and driveways and fix any cracks. Remove anything that might make you trip as you walk through a door, such as a raised step or threshold. Trim any bushes or trees on the path to your home. Use bright outdoor lighting. Clear any walking paths of anything that might make someone trip, such as rocks or tools. Regularly check to see if handrails are loose or broken. Make sure that both sides of any steps have handrails. Any raised decks and porches should have guardrails on the edges. Have any leaves, snow, or ice cleared regularly. Use sand or salt on walking paths during winter. Clean up any spills in your garage right away. This includes oil or grease spills. What can I do in the bathroom? Use night lights. Install grab bars by the toilet and in the tub and shower. Do not use towel bars as grab bars. Use non-skid mats or decals in the tub or shower. If you need to sit down in the shower, use a plastic, non-slip stool. Keep the floor dry. Clean up any  water that spills on the floor as soon as it happens. Remove soap buildup in the tub or shower regularly. Attach bath mats securely with double-sided non-slip rug tape. Do not have throw rugs and other things on the floor that can make you trip. What can I do in the bedroom? Use night lights. Make sure that you have a light by your bed that is easy to reach. Do not use any sheets or blankets that are too big for your bed. They should not hang down onto the floor. Have a firm chair that has side arms. You can use this for support while you get dressed. Do not have throw rugs and other things on the floor that can make you trip. What can I do in the kitchen? Clean up any spills right away. Avoid walking on wet floors. Keep items that you use a lot in easy-to-reach places. If you need to reach something above you, use a strong step stool that has a grab bar. Keep electrical cords out of the way. Do not use floor polish or wax that makes floors slippery. If you must use wax, use non-skid floor wax. Do not have throw rugs and other things on the floor that can make you trip. What can I do with my stairs? Do not leave any items on the stairs. Make sure that there are handrails on both sides of the  stairs and use them. Fix handrails that are broken or loose. Make sure that handrails are as long as the stairways. Check any carpeting to make sure that it is firmly attached to the stairs. Fix any carpet that is loose or worn. Avoid having throw rugs at the top or bottom of the stairs. If you do have throw rugs, attach them to the floor with carpet tape. Make sure that you have a light switch at the top of the stairs and the bottom of the stairs. If you do not have them, ask someone to add them for you. What else can I do to help prevent falls? Wear shoes that: Do not have high heels. Have rubber bottoms. Are comfortable and fit you well. Are closed at the toe. Do not wear sandals. If you use a  stepladder: Make sure that it is fully opened. Do not climb a closed stepladder. Make sure that both sides of the stepladder are locked into place. Ask someone to hold it for you, if possible. Clearly mark and make sure that you can see: Any grab bars or handrails. First and last steps. Where the edge of each step is. Use tools that help you move around (mobility aids) if they are needed. These include: Canes. Walkers. Scooters. Crutches. Turn on the lights when you go into a dark area. Replace any light bulbs as soon as they burn out. Set up your furniture so you have a clear path. Avoid moving your furniture around. If any of your floors are uneven, fix them. If there are any pets around you, be aware of where they are. Review your medicines with your doctor. Some medicines can make you feel dizzy. This can increase your chance of falling. Ask your doctor what other things that you can do to help prevent falls. This information is not intended to replace advice given to you by your health care provider. Make sure you discuss any questions you have with your health care provider. Document Released: 02/04/2009 Document Revised: 09/16/2015 Document Reviewed: 05/15/2014 Elsevier Interactive Patient Education  2017 [employer].

## 2021-04-20 DIAGNOSIS — H2513 Age-related nuclear cataract, bilateral: Secondary | ICD-10-CM | POA: Diagnosis not present

## 2021-04-20 DIAGNOSIS — H40033 Anatomical narrow angle, bilateral: Secondary | ICD-10-CM | POA: Diagnosis not present

## 2022-03-03 ENCOUNTER — Encounter: Payer: Self-pay | Admitting: Family Medicine

## 2022-03-06 ENCOUNTER — Ambulatory Visit: Payer: Medicare Other | Admitting: Family Medicine

## 2022-03-07 ENCOUNTER — Encounter: Payer: Medicare Other | Admitting: Family Medicine

## 2022-03-11 ENCOUNTER — Other Ambulatory Visit: Payer: Self-pay | Admitting: Family Medicine

## 2022-03-11 DIAGNOSIS — I10 Essential (primary) hypertension: Secondary | ICD-10-CM

## 2022-04-13 ENCOUNTER — Encounter: Payer: Self-pay | Admitting: Family Medicine

## 2022-04-13 ENCOUNTER — Ambulatory Visit (INDEPENDENT_AMBULATORY_CARE_PROVIDER_SITE_OTHER): Payer: Medicare Other | Admitting: Family Medicine

## 2022-04-13 VITALS — BP 127/70 | HR 67 | Temp 98.0°F | Ht 67.0 in | Wt 164.6 lb

## 2022-04-13 DIAGNOSIS — R051 Acute cough: Secondary | ICD-10-CM | POA: Diagnosis not present

## 2022-04-13 DIAGNOSIS — E559 Vitamin D deficiency, unspecified: Secondary | ICD-10-CM

## 2022-04-13 DIAGNOSIS — I10 Essential (primary) hypertension: Secondary | ICD-10-CM | POA: Diagnosis not present

## 2022-04-13 DIAGNOSIS — E782 Mixed hyperlipidemia: Secondary | ICD-10-CM | POA: Diagnosis not present

## 2022-04-13 DIAGNOSIS — Z0000 Encounter for general adult medical examination without abnormal findings: Secondary | ICD-10-CM

## 2022-04-13 DIAGNOSIS — Z0001 Encounter for general adult medical examination with abnormal findings: Secondary | ICD-10-CM

## 2022-04-13 DIAGNOSIS — J301 Allergic rhinitis due to pollen: Secondary | ICD-10-CM

## 2022-04-13 DIAGNOSIS — Z125 Encounter for screening for malignant neoplasm of prostate: Secondary | ICD-10-CM

## 2022-04-13 DIAGNOSIS — R5383 Other fatigue: Secondary | ICD-10-CM

## 2022-04-13 LAB — URINALYSIS
Bilirubin, UA: NEGATIVE
Glucose, UA: NEGATIVE
Ketones, UA: NEGATIVE
Leukocytes,UA: NEGATIVE
Nitrite, UA: NEGATIVE
RBC, UA: NEGATIVE
Specific Gravity, UA: >1.03 — ABNORMAL HIGH (ref 1.005–1.030)
Urobilinogen, Ur: 0.2 mg/dL (ref 0.2–1.0)
pH, UA: 6 (ref 5.0–7.5)

## 2022-04-13 MED ORDER — AMLODIPINE BESYLATE 10 MG PO TABS: 10.0000 mg | ORAL_TABLET | Freq: Every day | ORAL | 2 refills | Status: DC

## 2022-04-13 MED ORDER — LOSARTAN POTASSIUM-HCTZ 100-25 MG PO TABS: 1.0000 | ORAL_TABLET | Freq: Every day | ORAL | 2 refills | Status: DC

## 2022-04-13 NOTE — Progress Notes (Signed)
Subjective:  Patient ID: Cesar West, male    DOB: 05-18-1955  Age: 66 y.o. MRN: [id number]  CC: Annual Exam   HPI Cesar West presents for CPE  On Nov.7 did a routine task - deck board replaced X 2. Wore him out. Sick next day. Would give out after walking about 100 feet. Still coughing some.Energy is good now. Didn't test for Covid.   Wants chest XR. Coughing.      04/13/2022   10:36 AM 04/04/2021    2:51 PM 03/03/2021    8:08 AM  Depression screen PHQ 2/9  Decreased Interest 0 0 1  Down, Depressed, Hopeless 0 0 0  PHQ - 2 Score 0 0 1  Altered sleeping  0 0  Tired, decreased energy  0 1  Change in appetite  0 1  Feeling bad or failure about yourself   0 1  Trouble concentrating  0 1  Moving slowly or fidgety/restless  0 0  Suicidal thoughts  0 0  PHQ-9 Score  0 5  Difficult doing work/chores  Not difficult at all Not difficult at all    History Cesar West has a past medical history of Adenomatous colon polyp (7 /22/ 2010), Allergy, Cancer ([place]), Diverticulosis of colon (11/12/2008), GERD (gastroesophageal reflux disease), Hearing loss, Heart murmur, Hyperlipidemia, Hypertension, Internal hemorrhoid (11/12/2008), and Vitamin D deficiency.   He has a past surgical history that includes Colonoscopy; Polypectomy; and lump removed from right axilla area.   His family history includes Colon polyps in his brother; Heart disease in his father; Hypertension in his brother, father, and mother.He reports that he has never smoked. He has never used smokeless tobacco. He reports that he does not drink alcohol and does not use drugs.    ROS Review of Systems  Constitutional:  Positive for fatigue. Negative for activity change and unexpected weight change.  HENT:  Negative for congestion, ear pain, hearing loss, postnasal drip and trouble swallowing.   Eyes:  Negative for pain and visual disturbance.  Respiratory:  Positive for cough (occasional, paroxysms since illness 2 mos ago.).  Negative for chest tightness and shortness of breath.   Cardiovascular:  Negative for chest pain, palpitations and leg swelling.  Gastrointestinal:  Negative for abdominal distention, abdominal pain, blood in stool, constipation, diarrhea, nausea and vomiting.  Endocrine: Negative for cold intolerance, heat intolerance and polydipsia.  Genitourinary:  Negative for difficulty urinating, dysuria, flank pain, frequency and urgency.  Musculoskeletal:  Negative for arthralgias and joint swelling.  Skin:  Negative for color change, rash and wound.  Neurological:  Negative for dizziness, syncope, speech difficulty, weakness, light-headedness, numbness and headaches.  Hematological:  Does not bruise/bleed easily.  Psychiatric/Behavioral:  Negative for confusion, decreased concentration, dysphoric mood and sleep disturbance. The patient is not nervous/anxious.     Objective:  BP 127/70   Pulse 67   Temp 98 F (36.7 C)   Ht _0  (1.702 m)   Wt 164 lb 9.6 oz (74.7 kg)   SpO2 97%   BMI 25.78 kg/m   BP Readings from Last 3 Encounters:  04/13/22 127/70  03/03/21 (!) 141/72  02/09/20 (!) 142/80    Wt Readings from Last 3 Encounters:  04/13/22 164 lb 9.6 oz (74.7 kg)  04/04/21 168 lb (76.2 kg)  03/03/21 167 lb 12.8 oz (76.1 kg)     Physical Exam Constitutional:      Appearance: He is well-developed.  HENT:     Head: Normocephalic and atraumatic.  Eyes:  Pupils: Pupils are equal, round, and reactive to light.  Neck:     Thyroid: No thyromegaly.     Trachea: No tracheal deviation.  Cardiovascular:     Rate and Rhythm: Normal rate and regular rhythm.     Heart sounds: Normal heart sounds. No murmur heard.    No friction rub. No gallop.  Pulmonary:     Breath sounds: Normal breath sounds. No wheezing or rales.  Abdominal:     General: Bowel sounds are normal. There is no distension.     Palpations: Abdomen is soft. There is no mass.     Tenderness: There is no abdominal  tenderness.     Hernia: There is no hernia in the left inguinal area.  Genitourinary:    Penis: Normal.      Testes: Normal.  Musculoskeletal:        General: Normal range of motion.     Cervical back: Normal range of motion.  Lymphadenopathy:     Cervical: No cervical adenopathy.  Skin:    General: Skin is warm and dry.  Neurological:     Mental Status: He is alert and oriented to person, place, and time.       Assessment & Plan:   Cesar West was seen today for annual exam.  Diagnoses and all orders for this visit:  Vitamin D deficiency -     VITAMIN D 25 Hydroxy (Vit-D Deficiency, Fractures)  Screening for prostate cancer -     PSA, total and free  Mixed hyperlipidemia -     Lipid panel  Primary hypertension -     CBC with Differential/Platelet -     CMP14+EGFR -     amLODipine (NORVASC) 10 MG tablet; Take 1 tablet (10 mg total) by mouth daily. -     losartan-hydrochlorothiazide (HYZAAR) 100-25 MG tablet; Take 1 tablet by mouth daily. -     EKG 12-Lead -     DG Chest 2 View; Future  Well adult exam -     CBC with Differential/Platelet -     CMP14+EGFR -     Lipid panel -     Urinalysis -     VITAMIN D 25 Hydroxy (Vit-D Deficiency, Fractures) -     PSA, total and free -     EKG 12-Lead  Seasonal allergic rhinitis due to pollen  Acute cough -     DG Chest 2 View; Future  Fatigue, unspecified type -     EKG 12-Lead -     DG Chest 2 View; Future       I have discontinued Cesar West's fexofenadine-pseudoephedrine. I have also changed his amLODipine and losartan-hydrochlorothiazide. Additionally, I am having him maintain his cholecalciferol, aspirin EC, b complex vitamins, and fish oil-omega-3 fatty acids.  Allergies as of 04/13/2022       Reactions   Statins    Caused laziness        Medication List        Accurate as of April 13, 2022 11:30 AM. If you have any questions, ask your nurse or doctor.          STOP taking these  medications    fexofenadine-pseudoephedrine 180-240 MG 24 hr tablet Commonly known as: ALLEGRA-D 24 Stopped by: Cesar Fraise, MD       TAKE these medications    amLODipine 10 MG tablet Commonly known as: NORVASC Take 1 tablet (10 mg total) by mouth daily.   aspirin EC 81 MG tablet  Take 81 mg by mouth daily.   b complex vitamins capsule Take 1 capsule by mouth daily.   cholecalciferol 1000 units tablet Commonly known as: VITAMIN D Take 2,000 Units by mouth daily.   fish oil-omega-3 fatty acids 1000 MG capsule Take 2 capsules (2 g total) by mouth daily.   losartan-hydrochlorothiazide 100-25 MG tablet Commonly known as: HYZAAR Take 1 tablet by mouth daily.         Follow-up: No follow-ups on file.  Cesar West, M.D.

## 2022-04-14 LAB — CBC WITH DIFFERENTIAL/PLATELET
Basophils Absolute: 0.1 10*3/uL (ref 0.0–0.2)
Basos: 1 %
EOS (ABSOLUTE): 0.1 10*3/uL (ref 0.0–0.4)
Eos: 2 %
Hematocrit: 46.9 % (ref 37.5–51.0)
Hemoglobin: 15.7 g/dL (ref 13.0–17.7)
Immature Grans (Abs): 0 10*3/uL (ref 0.0–0.1)
Immature Granulocytes: 0 %
Lymphocytes Absolute: 2.4 10*3/uL (ref 0.7–3.1)
Lymphs: 32 %
MCH: 29.4 pg (ref 26.6–33.0)
MCHC: 33.5 g/dL (ref 31.5–35.7)
MCV: 88 fL (ref 79–97)
Monocytes Absolute: 0.9 10*3/uL (ref 0.1–0.9)
Monocytes: 12 %
Neutrophils Absolute: 4 10*3/uL (ref 1.4–7.0)
Neutrophils: 53 %
Platelets: 233 10*3/uL (ref 150–450)
RBC: 5.34 x10E6/uL (ref 4.14–5.80)
RDW: 12.7 % (ref 11.6–15.4)
WBC: 7.6 10*3/uL (ref 3.4–10.8)

## 2022-04-14 LAB — CMP14+EGFR
ALT: 28 IU/L (ref 0–44)
AST: 18 IU/L (ref 0–40)
Albumin/Globulin Ratio: 1.7 (ref 1.2–2.2)
Albumin: 4.7 g/dL (ref 3.9–4.9)
Alkaline Phosphatase: 97 IU/L (ref 44–121)
BUN/Creatinine Ratio: 11 (ref 10–24)
BUN: 12 mg/dL (ref 8–27)
Bilirubin Total: 1.2 mg/dL (ref 0.0–1.2)
CO2: 27 mmol/L (ref 20–29)
Calcium: 10.1 mg/dL (ref 8.6–10.2)
Chloride: 101 mmol/L (ref 96–106)
Creatinine, Ser: 1.08 mg/dL (ref 0.76–1.27)
Globulin, Total: 2.8 g/dL (ref 1.5–4.5)
Glucose: 113 mg/dL — ABNORMAL HIGH (ref 70–99)
Potassium: 4.7 mmol/L (ref 3.5–5.2)
Sodium: 141 mmol/L (ref 134–144)
Total Protein: 7.5 g/dL (ref 6.0–8.5)
eGFR: 75 mL/min/{1.73_m2} (ref >59)

## 2022-04-14 LAB — LIPID PANEL
Chol/HDL Ratio: 3.7 ratio (ref 0.0–5.0)
Cholesterol, Total: 190 mg/dL (ref 100–199)
HDL: 51 mg/dL (ref >39)
LDL Chol Calc (NIH): 114 mg/dL — ABNORMAL HIGH (ref 0–99)
Triglycerides: 140 mg/dL (ref 0–149)
VLDL Cholesterol Cal: 25 mg/dL (ref 5–40)

## 2022-04-14 LAB — PSA, TOTAL AND FREE
PSA, Free Pct: 34.4 %
PSA, Free: 1.1 ng/mL
Prostate Specific Ag, Serum: 3.2 ng/mL (ref 0.0–4.0)

## 2022-04-14 LAB — VITAMIN D 25 HYDROXY (VIT D DEFICIENCY, FRACTURES): Vit D, 25-Hydroxy: 104 ng/mL — ABNORMAL HIGH (ref 30.0–100.0)

## 2022-04-18 ENCOUNTER — Other Ambulatory Visit (INDEPENDENT_AMBULATORY_CARE_PROVIDER_SITE_OTHER): Payer: Medicare Other

## 2022-04-18 DIAGNOSIS — R059 Cough, unspecified: Secondary | ICD-10-CM | POA: Diagnosis not present

## 2022-04-18 DIAGNOSIS — I10 Essential (primary) hypertension: Secondary | ICD-10-CM | POA: Diagnosis not present

## 2022-04-18 DIAGNOSIS — R5383 Other fatigue: Secondary | ICD-10-CM | POA: Diagnosis not present

## 2022-04-18 DIAGNOSIS — R0600 Dyspnea, unspecified: Secondary | ICD-10-CM | POA: Diagnosis not present

## 2022-04-18 DIAGNOSIS — J449 Chronic obstructive pulmonary disease, unspecified: Secondary | ICD-10-CM | POA: Diagnosis not present

## 2022-04-18 DIAGNOSIS — R051 Acute cough: Secondary | ICD-10-CM

## 2022-04-24 NOTE — Progress Notes (Signed)
Your chest x-ray looked normal. Thanks, WS.

## 2022-04-25 NOTE — Progress Notes (Signed)
Hello Jahmel,  Your lab result is normal and/or stable.Some minor variations that are not significant are commonly marked abnormal, but do not represent any medical problem for you.  Best regards, Yassir Enis, M.D.

## 2022-07-05 ENCOUNTER — Telehealth: Payer: Self-pay | Admitting: Family Medicine

## 2022-07-05 NOTE — Telephone Encounter (Signed)
Contacted Cesar West to schedule their annual wellness visit. Appointment made for 07/17/2022.  Thank you,  Colletta [place],  [facility] Direct Dial ??[phone]

## 2022-07-16 NOTE — Patient Instructions (Signed)
Cesar West , Thank you for taking time to come for your Medicare Wellness Visit. I appreciate your ongoing commitment to your health goals. Please review the following plan we discussed and let me know if I can assist you in the future.   These are the goals we discussed:  Goals      Exercise 3x per week (30 min per time)     Increase exercise.         This is a list of the screening recommended for you and due dates:  Health Maintenance  Topic Date Due   COVID-19 Vaccine (3 - 2023-24 season) 12/23/2021   Medicare Annual Wellness Visit  04/04/2022   Colon Cancer Screening  03/25/2024   DTaP/Tdap/Td vaccine (4 - Td or Tdap) 11/15/2027   Pneumonia Vaccine  Completed   Flu Shot  Completed   Hepatitis C Screening: USPSTF Recommendation to screen - Ages 18-79 yo.  Completed   Zoster (Shingles) Vaccine  Completed   HPV Vaccine  Aged Out    Advanced directives: Forms are available if you choose in the future to pursue completion.  This is recommended in order to make sure that your health wishes are honored in the event that you are unable to verbalize them to the provider.    Conditions/risks identified: Aim for 30 minutes of exercise or brisk walking, 6-8 glasses of water, and 5 servings of fruits and vegetables each day.   Next appointment: Follow up in one year for your annual wellness visit.   Preventive Care 37 Years and Older, Male  Preventive care refers to lifestyle choices and visits with your health care provider that can promote health and wellness. What does preventive care include? A yearly physical exam. This is also called an annual well check. Dental exams once or twice a year. Routine eye exams. Ask your health care provider how often you should have your eyes checked. Personal lifestyle choices, including: Daily care of your teeth and gums. Regular physical activity. Eating a healthy diet. Avoiding tobacco and drug use. Limiting alcohol use. Practicing safe  sex. Taking low doses of aspirin every day. Taking vitamin and mineral supplements as recommended by your health care provider. What happens during an annual well check? The services and screenings done by your health care provider during your annual well check will depend on your age, overall health, lifestyle risk factors, and family history of disease. Counseling  Your health care provider may ask you questions about your: Alcohol use. Tobacco use. Drug use. Emotional well-being. Home and relationship well-being. Sexual activity. Eating habits. History of falls. Memory and ability to understand (cognition). Work and work Statistician. Screening  You may have the following tests or measurements: Height, weight, and BMI. Blood pressure. Lipid and cholesterol levels. These may be checked every 5 years, or more frequently if you are over 29 years old. Skin check. Lung cancer screening. You may have this screening every year starting at age 48 if you have a 30-pack-year history of smoking and currently smoke or have quit within the past 15 years. Fecal occult blood test (FOBT) of the stool. You may have this test every year starting at age 41. Flexible sigmoidoscopy or colonoscopy. You may have a sigmoidoscopy every 5 years or a colonoscopy every 10 years starting at age 71. Prostate cancer screening. Recommendations will vary depending on your family history and other risks. Hepatitis C blood test. Hepatitis B blood test. Sexually transmitted disease (STD) testing. Diabetes screening. This  is done by checking your blood sugar (glucose) after you have not eaten for a while (fasting). You may have this done every 1-3 years. Abdominal aortic aneurysm (AAA) screening. You may need this if you are a current or former smoker. Osteoporosis. You may be screened starting at age 21 if you are at high risk. Talk with your health care provider about your test results, treatment options, and if  necessary, the need for more tests. Vaccines  Your health care provider may recommend certain vaccines, such as: Influenza vaccine. This is recommended every year. Tetanus, diphtheria, and acellular pertussis (Tdap, Td) vaccine. You may need a Td booster every 10 years. Zoster vaccine. You may need this after age 84. Pneumococcal 13-valent conjugate (PCV13) vaccine. One dose is recommended after age 71. Pneumococcal polysaccharide (PPSV23) vaccine. One dose is recommended after age 41. Talk to your health care provider about which screenings and vaccines you need and how often you need them. This information is not intended to replace advice given to you by your health care provider. Make sure you discuss any questions you have with your health care provider. Document Released: 05/07/2015 Document Revised: 12/29/2015 Document Reviewed: 02/09/2015 Elsevier Interactive Patient Education  2017 [employer] Prevention in the Home Falls can cause injuries. They can happen to people of all ages. There are many things you can do to make your home safe and to help prevent falls. What can I do on the outside of my home? Regularly fix the edges of walkways and driveways and fix any cracks. Remove anything that might make you trip as you walk through a door, such as a raised step or threshold. Trim any bushes or trees on the path to your home. Use bright outdoor lighting. Clear any walking paths of anything that might make someone trip, such as rocks or tools. Regularly check to see if handrails are loose or broken. Make sure that both sides of any steps have handrails. Any raised decks and porches should have guardrails on the edges. Have any leaves, snow, or ice cleared regularly. Use sand or salt on walking paths during winter. Clean up any spills in your garage right away. This includes oil or grease spills. What can I do in the bathroom? Use night lights. Install grab bars by the toilet  and in the tub and shower. Do not use towel bars as grab bars. Use non-skid mats or decals in the tub or shower. If you need to sit down in the shower, use a plastic, non-slip stool. Keep the floor dry. Clean up any water that spills on the floor as soon as it happens. Remove soap buildup in the tub or shower regularly. Attach bath mats securely with double-sided non-slip rug tape. Do not have throw rugs and other things on the floor that can make you trip. What can I do in the bedroom? Use night lights. Make sure that you have a light by your bed that is easy to reach. Do not use any sheets or blankets that are too big for your bed. They should not hang down onto the floor. Have a firm chair that has side arms. You can use this for support while you get dressed. Do not have throw rugs and other things on the floor that can make you trip. What can I do in the kitchen? Clean up any spills right away. Avoid walking on wet floors. Keep items that you use a lot in easy-to-reach places. If you  need to reach something above you, use a strong step stool that has a grab bar. Keep electrical cords out of the way. Do not use floor polish or wax that makes floors slippery. If you must use wax, use non-skid floor wax. Do not have throw rugs and other things on the floor that can make you trip. What can I do with my stairs? Do not leave any items on the stairs. Make sure that there are handrails on both sides of the stairs and use them. Fix handrails that are broken or loose. Make sure that handrails are as long as the stairways. Check any carpeting to make sure that it is firmly attached to the stairs. Fix any carpet that is loose or worn. Avoid having throw rugs at the top or bottom of the stairs. If you do have throw rugs, attach them to the floor with carpet tape. Make sure that you have a light switch at the top of the stairs and the bottom of the stairs. If you do not have them, ask someone to add  them for you. What else can I do to help prevent falls? Wear shoes that: Do not have high heels. Have rubber bottoms. Are comfortable and fit you well. Are closed at the toe. Do not wear sandals. If you use a stepladder: Make sure that it is fully opened. Do not climb a closed stepladder. Make sure that both sides of the stepladder are locked into place. Ask someone to hold it for you, if possible. Clearly mark and make sure that you can see: Any grab bars or handrails. First and last steps. Where the edge of each step is. Use tools that help you move around (mobility aids) if they are needed. These include: Canes. Walkers. Scooters. Crutches. Turn on the lights when you go into a dark area. Replace any light bulbs as soon as they burn out. Set up your furniture so you have a clear path. Avoid moving your furniture around. If any of your floors are uneven, fix them. If there are any pets around you, be aware of where they are. Review your medicines with your doctor. Some medicines can make you feel dizzy. This can increase your chance of falling. Ask your doctor what other things that you can do to help prevent falls. This information is not intended to replace advice given to you by your health care provider. Make sure you discuss any questions you have with your health care provider. Document Released: 02/04/2009 Document Revised: 09/16/2015 Document Reviewed: 05/15/2014 Elsevier Interactive Patient Education  2017 [employer].

## 2022-07-16 NOTE — Progress Notes (Unsigned)
Subjective:   Cesar West is a 67 y.o. male who presents for Medicare Annual/Subsequent preventive examination.  Review of Systems    ***       Objective:    There were no vitals filed for this visit. There is no height or weight on file to calculate BMI.     04/04/2021    2:56 PM  Advanced Directives  Does Patient Have a Medical Advance Directive? Yes  Type of Advance Directive Living will    Current Medications (verified) Outpatient Encounter Medications as of 07/17/2022  Medication Sig   amLODipine (NORVASC) 10 MG tablet Take 1 tablet (10 mg total) by mouth daily.   aspirin EC 81 MG tablet Take 81 mg by mouth daily.   b complex vitamins capsule Take 1 capsule by mouth daily.   cholecalciferol (VITAMIN D) 1000 UNITS tablet Take 2,000 Units by mouth daily.   fish oil-omega-3 fatty acids 1000 MG capsule Take 2 capsules (2 g total) by mouth daily.   losartan-hydrochlorothiazide (HYZAAR) 100-25 MG tablet Take 1 tablet by mouth daily.   No facility-administered encounter medications on file as of 07/17/2022.    Allergies (verified) Statins   History: Past Medical History:  Diagnosis Date   Adenomatous colon polyp 7 /22/ 2010   Allergy    mild   Cancer (HCC)    basal cell skin of the right triceps area   Diverticulosis of colon 11/12/2008   left colon   GERD (gastroesophageal reflux disease)    some if eats  late    Hearing loss    right ear   Heart murmur    Hyperlipidemia    Hypertension    Internal hemorrhoid 11/12/2008   Vitamin D deficiency    Past Surgical History:  Procedure Laterality Date   COLONOSCOPY     lump removed from right axilla area     POLYPECTOMY     Family History  Problem Relation Age of Onset   Hypertension Mother    Hypertension Father    Heart disease Father    Hypertension Brother    Colon polyps Brother    Colon cancer Neg Hx    Esophageal cancer Neg Hx    Rectal cancer Neg Hx    Stomach cancer Neg Hx    Social History    Socioeconomic History   Marital status: Single    Spouse name: Not on file   Number of children: Not on file   Years of education: Not on file   Highest education level: Not on file  Occupational History   Not on file  Tobacco Use   Smoking status: Never   Smokeless tobacco: Never  Substance and Sexual Activity   Alcohol use: No   Drug use: No   Sexual activity: Not on file  Other Topics Concern   Not on file  Social History Narrative   Not on file   Social Determinants of Health   Financial Resource Strain: Low Risk  (04/04/2021)   Overall Financial Resource Strain (CARDIA)    Difficulty of Paying Living Expenses: Not very hard  Food Insecurity: No Food Insecurity (04/04/2021)   Hunger Vital Sign    Worried About Running Out of Food in the Last Year: Never true    Ran Out of Food in the Last Year: Never true  Transportation Needs: No Transportation Needs (04/04/2021)   PRAPARE - Hydrologist (Medical): No    Lack of Transportation (Non-Medical):  No  Physical Activity: Inactive (04/04/2021)   Exercise Vital Sign    Days of Exercise per Week: 0 days    Minutes of Exercise per Session: 0 min  Stress: No Stress Concern Present (04/04/2021)   [place]    Feeling of Stress : Not at all  Social Connections: Moderately Isolated (04/04/2021)   Social Connection and Isolation Panel [NHANES]    Frequency of Communication with Friends and Family: More than three times a week    Frequency of Social Gatherings with Friends and Family: More than three times a week    Attends Religious Services: Never    Marine scientist or Organizations: No    Attends Music therapist: 1 to 4 times per year    Marital Status: Never married    Tobacco Counseling Counseling given: Not Answered   Clinical Intake:                 Diabetic?No          Activities  of Daily Living     No data to display          Patient Care Team: Claretta Fraise, MD as PCP - General (Family Medicine)  Indicate any recent Medical Services you may have received from other than [facility] providers in the past year (date may be approximate).     Assessment:   This is a routine wellness examination for Cesar West.  Hearing/Vision screen No results found.  Dietary issues and exercise activities discussed:     Goals Addressed   None    Depression Screen    04/13/2022   10:36 AM 04/04/2021    2:51 PM 03/03/2021    8:08 AM 03/03/2021    8:00 AM 02/09/2020    9:23 AM 02/06/2019   10:46 AM 09/23/2018    2:29 PM  PHQ 2/9 Scores  PHQ - 2 Score 0 0 1 0 0 0 0  PHQ- 9 Score  0 5    0    Fall Risk    04/13/2022   10:36 AM 04/04/2021    2:57 PM 03/03/2021    8:00 AM 02/09/2020    9:23 AM 02/06/2019   10:46 AM  Fall Risk   Falls in the past year? 0 0 0 0 0  Number falls in past yr:  0  0   Injury with Fall?  0  0   Risk for fall due to :  Impaired vision     Follow up  Falls prevention discussed       FALL RISK PREVENTION PERTAINING TO THE HOME:  Any stairs in or around the home? {YES/NO:21197} If so, are there any without handrails? {YES/NO:21197} Home free of loose throw rugs in walkways, pet beds, electrical cords, etc? {YES/NO:21197} Adequate lighting in your home to reduce risk of falls? {YES/NO:21197}  ASSISTIVE DEVICES UTILIZED TO PREVENT FALLS:  Life alert? {YES/NO:21197} Use of a cane, walker or w/c? {YES/NO:21197} Grab bars in the bathroom? {YES/NO:21197} Shower chair or bench in shower? {YES/NO:21197} Elevated toilet seat or a handicapped toilet? {YES/NO:21197}  TIMED UP AND GO:  Was the test performed? No . Telephonic visit  Cognitive Function:        04/04/2021    3:01 PM  6CIT Screen  What Year? 0 points  What month? 0 points  What time? 0 points  Count back from 20 0 points  Months in reverse 0 points  Repeat phrase 2  points  Total Score 2 points    Immunizations Immunization History  Administered Date(s) Administered   Fluad Quad(high Dose 65+) 02/06/2019   Influenza Split 01/16/2013   Influenza Whole 04/04/2012   Influenza, High Dose Seasonal PF 01/16/2020, 12/31/2020   Influenza, Seasonal, Injecte, Preservative Fre 01/05/2014   Influenza,inj,Quad PF,6+ Mos 02/08/2015, 02/04/2018, 12/15/2021   Influenza-Unspecified 01/17/2016, 03/01/2017   Janssen (J&J) SARS-COV-2 Vaccination 10/03/2019   Moderna Sars-Covid-2 Vaccination 04/30/2020   Pneumococcal Conjugate-13 05/11/2014   Pneumococcal Polysaccharide-23 09/23/2018   Td 04/09/2007, 03/06/2011   Tdap 11/14/2017   Zoster Recombinat (Shingrix) 06/06/2018, 06/06/2018, 10/31/2018   Zoster, Live 07/20/2014    TDAP status: Up to date  Flu Vaccine status: Up to date  Pneumococcal vaccine status: Up to date  Covid-19 vaccine status: Information provided on how to obtain vaccines.   Qualifies for Shingles Vaccine? Yes   Zostavax completed No   Shingrix Completed?: Yes  Screening Tests Health Maintenance  Topic Date Due   COVID-19 Vaccine (3 - 2023-24 season) 12/23/2021   Medicare Annual Wellness (AWV)  04/04/2022   COLONOSCOPY (Pts 45-38yrs Insurance coverage will need to be confirmed)  03/25/2024   DTaP/Tdap/Td (4 - Td or Tdap) 11/15/2027   Pneumonia Vaccine 61+ Years old  Completed   INFLUENZA VACCINE  Completed   Hepatitis C Screening  Completed   Zoster Vaccines- Shingrix  Completed   HPV VACCINES  Aged Out    Health Maintenance  Health Maintenance Due  Topic Date Due   COVID-19 Vaccine (3 - 2023-24 season) 12/23/2021   Medicare Annual Wellness (AWV)  04/04/2022    Colorectal cancer screening: Type of screening: Colonoscopy. Completed 04/13/19. Repeat every 5 years  Lung Cancer Screening: (Low Dose CT Chest recommended if Age 76-80 years, 30 pack-year currently smoking OR have quit w/in 15years.) does not qualify.   Lung  Cancer Screening Referral: n/a  Additional Screening:  Hepatitis C Screening: does qualify; Completed 03/28/13  Vision Screening: Recommended annual ophthalmology exams for early detection of glaucoma and other disorders of the eye. Is the patient up to date with their annual eye exam?  {YES/NO:21197} Who is the provider or what is the name of the office in which the patient attends annual eye exams? *** If pt is not established with a provider, would they like to be referred to a provider to establish care? {YES/NO:21197}.   Dental Screening: Recommended annual dental exams for proper oral hygiene  Community Resource Referral / Chronic Care Management: CRR required this visit?  {YES/NO:21197}  CCM required this visit?  {YES/NO:21197}     Plan:     I have personally reviewed and noted the following in the patient's chart:   Medical and social history Use of alcohol, tobacco or illicit drugs  Current medications and supplements including opioid prescriptions. {Opioid Prescriptions:[phone]} Functional ability and status Nutritional status Physical activity Advanced directives List of other physicians Hospitalizations, surgeries, and ER visits in previous 12 months Vitals Screenings to include cognitive, depression, and falls Referrals and appointments  In addition, I have reviewed and discussed with patient certain preventive protocols, quality metrics, and best practice recommendations. A written personalized care plan for preventive services as well as general preventive health recommendations were provided to patient.     Vanetta Mulders, [place]   [id number]   Due to this being a virtual visit, the after visit summary with patients personalized plan was offered to patient via mail or my-chart. ***Patient declined at this time./  Patient would like to access on my-chart/ per request, patient was mailed a copy of AVS./ Patient preferred to pick up at office at next  visit   Nurse Notes: ***

## 2022-07-17 ENCOUNTER — Ambulatory Visit (INDEPENDENT_AMBULATORY_CARE_PROVIDER_SITE_OTHER): Payer: Medicare Other

## 2022-07-17 VITALS — Ht 67.0 in | Wt 164.0 lb

## 2022-07-17 DIAGNOSIS — Z0000 Encounter for general adult medical examination without abnormal findings: Secondary | ICD-10-CM

## 2022-12-07 ENCOUNTER — Other Ambulatory Visit: Payer: Self-pay | Admitting: Family Medicine

## 2022-12-07 DIAGNOSIS — I10 Essential (primary) hypertension: Secondary | ICD-10-CM

## 2023-03-22 ENCOUNTER — Other Ambulatory Visit: Payer: Self-pay | Admitting: Family Medicine

## 2023-03-22 DIAGNOSIS — I10 Essential (primary) hypertension: Secondary | ICD-10-CM

## 2023-05-02 ENCOUNTER — Ambulatory Visit (INDEPENDENT_AMBULATORY_CARE_PROVIDER_SITE_OTHER): Payer: Medicare Other | Admitting: Family Medicine

## 2023-05-02 ENCOUNTER — Encounter: Payer: Self-pay | Admitting: Family Medicine

## 2023-05-02 VITALS — BP 125/63 | HR 60 | Temp 97.4°F | Ht 67.0 in | Wt 165.0 lb

## 2023-05-02 DIAGNOSIS — E559 Vitamin D deficiency, unspecified: Secondary | ICD-10-CM

## 2023-05-02 DIAGNOSIS — Z125 Encounter for screening for malignant neoplasm of prostate: Secondary | ICD-10-CM

## 2023-05-02 DIAGNOSIS — I10 Essential (primary) hypertension: Secondary | ICD-10-CM

## 2023-05-02 DIAGNOSIS — E782 Mixed hyperlipidemia: Secondary | ICD-10-CM | POA: Diagnosis not present

## 2023-05-02 MED ORDER — TAMSULOSIN HCL 0.4 MG PO CAPS: 0.4000 mg | ORAL_CAPSULE | Freq: Two times a day (BID) | ORAL | 3 refills | Status: DC

## 2023-05-02 MED ORDER — LOSARTAN POTASSIUM 100 MG PO TABS: 100.0000 mg | ORAL_TABLET | Freq: Every day | ORAL | 1 refills | Status: DC

## 2023-05-02 MED ORDER — AMLODIPINE BESYLATE 10 MG PO TABS: 10.0000 mg | ORAL_TABLET | Freq: Every day | ORAL | 0 refills | Status: DC

## 2023-05-02 NOTE — Progress Notes (Signed)
 Subjective:  Patient ID: Cesar West, male    DOB: 21-Dec-1955  Age: 68 y.o. MRN: [id number]  CC: Urinary Urgency (Pt has urgency and sometimes leak. 6 months) and Dizziness (Pt states that Cesar West is not dizzy but off balance sometimes. Off and on for 6 months. )   HPI Cesar West presents for off balance all the time. Has fallen. Tends to lean forward. Hard to catch himself. Only one fall. That was when Cesar West was stepping from joist to joist when resurfacing his floor.   Having urinary urgency. Will leak. Going more frequently. No burning, No flank pain. Ongoing for several months.      05/02/2023   10:30 AM 07/17/2022   11:11 AM 04/13/2022   10:36 AM  Depression screen PHQ 2/9  Decreased Interest 0 0 0  Down, Depressed, Hopeless 0 0 0  PHQ - 2 Score 0 0 0  Altered sleeping 3    Tired, decreased energy 0    Change in appetite 0    Feeling bad or failure about yourself  0    Trouble concentrating 0    Moving slowly or fidgety/restless 0    Suicidal thoughts 0    PHQ-9 Score 3      History Cesar West has a past medical history of Adenomatous colon polyp (7 /22/ 2010), Allergy, Cancer (HCC), Diverticulosis of colon (11/12/2008), GERD (gastroesophageal reflux disease), Hearing loss, Heart murmur, Hyperlipidemia, Hypertension, Internal hemorrhoid (11/12/2008), and Vitamin D  deficiency.   Cesar West has a past surgical history that includes Colonoscopy; Polypectomy; and lump removed from right axilla area.   His family history includes Colon polyps in his brother; Heart disease in his father; Hypertension in his brother, father, and mother.Cesar West reports that Cesar West has never smoked. Cesar West has never used smokeless tobacco. Cesar West reports that Cesar West does not drink alcohol and does not use drugs.    ROS Review of Systems  Constitutional:  Negative for fever.  Respiratory:  Negative for shortness of breath.   Cardiovascular:  Negative for chest pain.  Genitourinary:  Positive for difficulty urinating and frequency.  Negative for decreased urine volume, dysuria, flank pain, penile swelling and testicular pain.  Musculoskeletal:  Negative for arthralgias.  Skin:  Negative for rash.  Psychiatric/Behavioral:  Negative for confusion and sleep disturbance. The patient is not nervous/anxious.     Objective:  BP 125/63   Pulse 60   Temp (!) 97.4 F (36.3 C) (Temporal)   Ht 5' 7 (1.702 m)   Wt 165 lb (74.8 kg)   SpO2 97%   BMI 25.84 kg/m   BP Readings from Last 3 Encounters:  05/02/23 125/63  04/13/22 127/70  03/03/21 (!) 141/72    Wt Readings from Last 3 Encounters:  05/02/23 165 lb (74.8 kg)  07/17/22 164 lb (74.4 kg)  04/13/22 164 lb 9.6 oz (74.7 kg)     Physical Exam Vitals reviewed.  Constitutional:      Appearance: Cesar West is well-developed.  HENT:     Head: Normocephalic and atraumatic.     Right Ear: External ear normal.     Left Ear: External ear normal.     Mouth/Throat:     Pharynx: No oropharyngeal exudate or posterior oropharyngeal erythema.  Eyes:     Pupils: Pupils are equal, round, and reactive to light.  Cardiovascular:     Rate and Rhythm: Normal rate and regular rhythm.     Heart sounds: No murmur heard. Pulmonary:     Effort: No respiratory distress.  Breath sounds: Normal breath sounds.  Musculoskeletal:     Cervical back: Normal range of motion and neck supple.  Neurological:     Mental Status: Cesar West is alert and oriented to person, place, and time.       Assessment & Plan:   Gaynor was seen today for urinary urgency and dizziness.  Diagnoses and all orders for this visit:  Vitamin D  deficiency -     VITAMIN D  25 Hydroxy (Vit-D Deficiency, Fractures)  Primary hypertension -     amLODipine  (NORVASC ) 10 MG tablet; Take 1 tablet (10 mg total) by mouth daily. -     CMP14+EGFR -     CBC with Differential/Platelet -     Lipid panel  Mixed hyperlipidemia  Prostate cancer screening -     PSA, total and free  Other orders -     losartan  (COZAAR ) 100  MG tablet; Take 1 tablet (100 mg total) by mouth daily. -     tamsulosin  (FLOMAX ) 0.4 MG CAPS capsule; Take 1 capsule (0.4 mg total) by mouth 2 (two) times daily. For urine flow and prostate       I have discontinued Nevan Guggenheim's losartan -hydrochlorothiazide . I am also having him start on losartan  and tamsulosin . Additionally, I am having him maintain his cholecalciferol, aspirin EC, b complex vitamins, fish oil-omega-3 fatty acids , and amLODipine .  Allergies as of 05/02/2023       Reactions   Statins    Caused laziness        Medication List        Accurate as of May 02, 2023  8:29 PM. If you have any questions, ask your nurse or doctor.          STOP taking these medications    losartan -hydrochlorothiazide  100-25 MG tablet Commonly known as: HYZAAR Stopped by: Juanna Pudlo       TAKE these medications    amLODipine  10 MG tablet Commonly known as: NORVASC  Take 1 tablet (10 mg total) by mouth daily.   aspirin EC 81 MG tablet Take 81 mg by mouth daily.   b complex vitamins capsule Take 1 capsule by mouth daily.   cholecalciferol 1000 units tablet Commonly known as: VITAMIN D  Take 2,000 Units by mouth daily.   fish oil-omega-3 fatty acids  1000 MG capsule Take 2 capsules (2 g total) by mouth daily.   losartan  100 MG tablet Commonly known as: COZAAR  Take 1 tablet (100 mg total) by mouth daily. Started by: Butler Kendalynn Wideman   tamsulosin  0.4 MG Caps capsule Commonly known as: FLOMAX  Take 1 capsule (0.4 mg total) by mouth 2 (two) times daily. For urine flow and prostate Started by: Butler Niaomi Cartaya         Follow-up: Return in about 6 weeks (around 06/13/2023).  Butler Der, M.D.

## 2023-05-03 LAB — CMP14+EGFR
ALT: 30 [IU]/L (ref 0–44)
AST: 25 [IU]/L (ref 0–40)
Albumin: 4.5 g/dL (ref 3.9–4.9)
Alkaline Phosphatase: 109 [IU]/L (ref 44–121)
BUN/Creatinine Ratio: 9 — ABNORMAL LOW (ref 10–24)
BUN: 11 mg/dL (ref 8–27)
Bilirubin Total: 1.6 mg/dL — ABNORMAL HIGH (ref 0.0–1.2)
CO2: 26 mmol/L (ref 20–29)
Calcium: 10.6 mg/dL — ABNORMAL HIGH (ref 8.6–10.2)
Chloride: 100 mmol/L (ref 96–106)
Creatinine, Ser: 1.18 mg/dL (ref 0.76–1.27)
Globulin, Total: 2.8 g/dL (ref 1.5–4.5)
Glucose: 109 mg/dL — ABNORMAL HIGH (ref 70–99)
Potassium: 5.2 mmol/L (ref 3.5–5.2)
Sodium: 140 mmol/L (ref 134–144)
Total Protein: 7.3 g/dL (ref 6.0–8.5)
eGFR: 67 mL/min/{1.73_m2} (ref >59)

## 2023-05-03 LAB — PSA, TOTAL AND FREE
PSA, Free Pct: 33.6 %
PSA, Free: 1.51 ng/mL
Prostate Specific Ag, Serum: 4.5 ng/mL — ABNORMAL HIGH (ref 0.0–4.0)

## 2023-05-03 LAB — LIPID PANEL
Chol/HDL Ratio: 3.5 {ratio} (ref 0.0–5.0)
Cholesterol, Total: 184 mg/dL (ref 100–199)
HDL: 53 mg/dL (ref >39)
LDL Chol Calc (NIH): 116 mg/dL — ABNORMAL HIGH (ref 0–99)
Triglycerides: 83 mg/dL (ref 0–149)
VLDL Cholesterol Cal: 15 mg/dL (ref 5–40)

## 2023-05-03 LAB — CBC WITH DIFFERENTIAL/PLATELET
Basophils Absolute: 0.1 10*3/uL (ref 0.0–0.2)
Basos: 1 %
EOS (ABSOLUTE): 0.2 10*3/uL (ref 0.0–0.4)
Eos: 3 %
Hematocrit: 49.1 % (ref 37.5–51.0)
Hemoglobin: 15.9 g/dL (ref 13.0–17.7)
Immature Grans (Abs): 0 10*3/uL (ref 0.0–0.1)
Immature Granulocytes: 0 %
Lymphocytes Absolute: 2.2 10*3/uL (ref 0.7–3.1)
Lymphs: 31 %
MCH: 29.1 pg (ref 26.6–33.0)
MCHC: 32.4 g/dL (ref 31.5–35.7)
MCV: 90 fL (ref 79–97)
Monocytes Absolute: 0.8 10*3/uL (ref 0.1–0.9)
Monocytes: 12 %
Neutrophils Absolute: 3.8 10*3/uL (ref 1.4–7.0)
Neutrophils: 53 %
Platelets: 224 10*3/uL (ref 150–450)
RBC: 5.47 x10E6/uL (ref 4.14–5.80)
RDW: 11.9 % (ref 11.6–15.4)
WBC: 7.1 10*3/uL (ref 3.4–10.8)

## 2023-05-03 LAB — VITAMIN D 25 HYDROXY (VIT D DEFICIENCY, FRACTURES): Vit D, 25-Hydroxy: 69.3 ng/mL (ref 30.0–100.0)

## 2023-05-08 ENCOUNTER — Other Ambulatory Visit: Payer: Self-pay | Admitting: *Deleted

## 2023-05-08 DIAGNOSIS — R972 Elevated prostate specific antigen [PSA]: Secondary | ICD-10-CM

## 2023-06-13 ENCOUNTER — Ambulatory Visit: Payer: Medicare Other | Admitting: Family Medicine

## 2023-06-20 ENCOUNTER — Ambulatory Visit (INDEPENDENT_AMBULATORY_CARE_PROVIDER_SITE_OTHER): Payer: Medicare Other | Admitting: Family Medicine

## 2023-06-20 ENCOUNTER — Encounter: Payer: Self-pay | Admitting: Family Medicine

## 2023-06-20 DIAGNOSIS — I10 Essential (primary) hypertension: Secondary | ICD-10-CM | POA: Diagnosis not present

## 2023-06-20 MED ORDER — LOSARTAN POTASSIUM-HCTZ 100-25 MG PO TABS: 1.0000 | ORAL_TABLET | Freq: Every day | ORAL | 3 refills | Status: AC

## 2023-06-20 MED ORDER — AMLODIPINE BESYLATE 5 MG PO TABS: 5.0000 mg | ORAL_TABLET | Freq: Every day | ORAL | 3 refills | Status: AC

## 2023-06-20 NOTE — Patient Instructions (Signed)
 Dc losartan. Start taking Losartan HCTZ. Change amlodipine from 10 mg to 5 mg daily

## 2023-06-20 NOTE — Progress Notes (Signed)
 Subjective:  Patient ID: Cesar West, male    DOB: 1955/06/28  Age: 68 y.o. MRN: [id number]  CC: Medical Management of Chronic Issues (Pt having swelling and weight gain since taken off of diuretic. ) and Gait Problem (Pt states that he has discussed with you previously. Still off balance.)   HPI Cesar West presents for  follow-up of hypertension. Patient has no history of headache chest pain or shortness of breath or recent cough. Patient also denies symptoms of TIA such as focal numbness or weakness. Patient denies side effects from medication. States taking it regularly. Wants to resum diuretic. The balance problem is minor. Not interested I using a cane or walker.    History Cesar West has a past medical history of Adenomatous colon polyp (7 /22/ 2010), Allergy, Cancer (HCC), Diverticulosis of colon (11/12/2008), GERD (gastroesophageal reflux disease), Hearing loss, Heart murmur, Hyperlipidemia, Hypertension, Internal hemorrhoid (11/12/2008), and Vitamin D deficiency.   He has a past surgical history that includes Colonoscopy; Polypectomy; and lump removed from right axilla area.   His family history includes Colon polyps in his brother; Heart disease in his father; Hypertension in his brother, father, and mother.He reports that he has never smoked. He has never used smokeless tobacco. He reports that he does not drink alcohol and does not use drugs.  Current Outpatient Medications on File Prior to Visit  Medication Sig Dispense Refill   aspirin EC 81 MG tablet Take 81 mg by mouth daily.     b complex vitamins capsule Take 1 capsule by mouth daily.     cholecalciferol (VITAMIN D) 1000 UNITS tablet Take 2,000 Units by mouth daily.     fish oil-omega-3 fatty acids 1000 MG capsule Take 2 capsules (2 g total) by mouth daily.     tamsulosin (FLOMAX) 0.4 MG CAPS capsule Take 1 capsule (0.4 mg total) by mouth 2 (two) times daily. For urine flow and prostate 180 capsule 3   No current  facility-administered medications on file prior to visit.    ROS Review of Systems  Constitutional:  Negative for fever.  Respiratory:  Negative for shortness of breath.   Cardiovascular:  Negative for chest pain.  Musculoskeletal:  Positive for gait problem (balance problem. He downplays it. States it is just that he wanted me to be aware). Negative for arthralgias.  Skin:  Negative for rash.  Neurological:  Negative for dizziness.    Objective:  BP 114/65   Pulse 70   Temp 97.9 F (36.6 C)   Ht 5\' 7"  (1.702 m)   SpO2 97%   BMI 25.84 kg/m   BP Readings from Last 3 Encounters:  06/20/23 114/65  05/02/23 125/63  04/13/22 127/70    Wt Readings from Last 3 Encounters:  05/02/23 165 lb (74.8 kg)  07/17/22 164 lb (74.4 kg)  04/13/22 164 lb 9.6 oz (74.7 kg)     Physical Exam Vitals reviewed.  Constitutional:      Appearance: He is well-developed.  HENT:     Head: Normocephalic and atraumatic.     Right Ear: External ear normal.     Left Ear: External ear normal.     Mouth/Throat:     Pharynx: No oropharyngeal exudate or posterior oropharyngeal erythema.  Eyes:     Pupils: Pupils are equal, round, and reactive to light.  Cardiovascular:     Rate and Rhythm: Normal rate and regular rhythm.     Heart sounds: No murmur heard. Pulmonary:     Effort:  No respiratory distress.     Breath sounds: Normal breath sounds.  Musculoskeletal:        General: Normal range of motion.     Cervical back: Normal range of motion and neck supple.  Skin:    General: Skin is warm and dry.  Neurological:     General: No focal deficit present.     Mental Status: He is alert and oriented to person, place, and time.     Motor: No weakness.     Gait: Gait normal.       Assessment & Plan:   Cesar West was seen today for medical management of chronic issues and gait problem.  Diagnoses and all orders for this visit:  Primary hypertension -     amLODipine (NORVASC) 5 MG tablet; Take  1 tablet (5 mg total) by mouth daily.  Other orders -     losartan-hydrochlorothiazide (HYZAAR) 100-25 MG tablet; Take 1 tablet by mouth daily.   Allergies as of 06/20/2023       Reactions   Statins    Caused laziness        Medication List        Accurate as of June 20, 2023 11:59 PM. If you have any questions, ask your nurse or doctor.          STOP taking these medications    losartan 100 MG tablet Commonly known as: COZAAR Stopped by: Cesar West       TAKE these medications    amLODipine 5 MG tablet Commonly known as: NORVASC Take 1 tablet (5 mg total) by mouth daily. What changed:  medication strength how much to take Changed by: Cesar West   aspirin EC 81 MG tablet Take 81 mg by mouth daily.   b complex vitamins capsule Take 1 capsule by mouth daily.   cholecalciferol 1000 units tablet Commonly known as: VITAMIN D Take 2,000 Units by mouth daily.   fish oil-omega-3 fatty acids 1000 MG capsule Take 2 capsules (2 g total) by mouth daily.   losartan-hydrochlorothiazide 100-25 MG tablet Commonly known as: HYZAAR Take 1 tablet by mouth daily. Started by: Cesar West   tamsulosin 0.4 MG Caps capsule Commonly known as: FLOMAX Take 1 capsule (0.4 mg total) by mouth 2 (two) times daily. For urine flow and prostate        Meds ordered this encounter  Medications   amLODipine (NORVASC) 5 MG tablet    Sig: Take 1 tablet (5 mg total) by mouth daily.    Dispense:  90 tablet    Refill:  3   losartan-hydrochlorothiazide (HYZAAR) 100-25 MG tablet    Sig: Take 1 tablet by mouth daily.    Dispense:  90 tablet    Refill:  3     Follow-up: Return in about 3 months (around 09/17/2023) for [facility] physical.  Mechele Claude, M.D.

## 2023-09-19 ENCOUNTER — Encounter: Payer: Self-pay | Admitting: Family Medicine

## 2023-09-19 ENCOUNTER — Ambulatory Visit (INDEPENDENT_AMBULATORY_CARE_PROVIDER_SITE_OTHER): Payer: Medicare Other | Admitting: Family Medicine

## 2023-09-19 VITALS — BP 117/57 | HR 87 | Temp 97.5°F | Ht 67.0 in | Wt 167.6 lb

## 2023-09-19 DIAGNOSIS — H9191 Unspecified hearing loss, right ear: Secondary | ICD-10-CM

## 2023-09-19 DIAGNOSIS — I10 Essential (primary) hypertension: Secondary | ICD-10-CM

## 2023-09-19 DIAGNOSIS — E782 Mixed hyperlipidemia: Secondary | ICD-10-CM | POA: Diagnosis not present

## 2023-09-19 DIAGNOSIS — Z0001 Encounter for general adult medical examination with abnormal findings: Secondary | ICD-10-CM

## 2023-09-19 DIAGNOSIS — Z0000 Encounter for general adult medical examination without abnormal findings: Secondary | ICD-10-CM

## 2023-09-19 DIAGNOSIS — R972 Elevated prostate specific antigen [PSA]: Secondary | ICD-10-CM

## 2023-09-19 DIAGNOSIS — Z789 Other specified health status: Secondary | ICD-10-CM

## 2023-09-19 LAB — MICROSCOPIC EXAMINATION
Bacteria, UA: NONE SEEN
Epithelial Cells (non renal): NONE SEEN /HPF (ref 0–10)
Renal Epithel, UA: NONE SEEN /HPF
Yeast, UA: NONE SEEN

## 2023-09-19 LAB — URINALYSIS, COMPLETE
Bilirubin, UA: NEGATIVE
Glucose, UA: NEGATIVE
Nitrite, UA: NEGATIVE
Protein,UA: NEGATIVE
RBC, UA: NEGATIVE
Specific Gravity, UA: 1.02 (ref 1.005–1.030)
Urobilinogen, Ur: 1 mg/dL (ref 0.2–1.0)
pH, UA: 7 (ref 5.0–7.5)

## 2023-09-19 LAB — LIPID PANEL

## 2023-09-19 MED ORDER — OXYBUTYNIN CHLORIDE ER 10 MG PO TB24: 10.0000 mg | ORAL_TABLET | Freq: Every day | ORAL | 2 refills | Status: DC

## 2023-09-19 NOTE — Progress Notes (Signed)
 Subjective:  Patient ID: Sydnee Evans, male    DOB: October 27, 1955  Age: 68 y.o. MRN: [id number]  CC: Annual Exam and Knee Pain (Would like to stop flomax  states it does not help)   HPI Avian Konigsberg presents for CPE. Tamsulosin  not helpful. Has heart murmur that was first diagnosed at age 58. Denies any sx ever occurring.      09/19/2023   10:38 AM 06/20/2023   10:52 AM 05/02/2023   10:30 AM  Depression screen PHQ 2/9  Decreased Interest 0 1 0  Down, Depressed, Hopeless 0 0 0  PHQ - 2 Score 0 1 0  Altered sleeping 0 2 3  Tired, decreased energy 0 1 0  Change in appetite 0 0 0  Feeling bad or failure about yourself  0 0 0  Trouble concentrating 0 0 0  Moving slowly or fidgety/restless 0 0 0  Suicidal thoughts 0 0 0  PHQ-9 Score 0 4 3  Difficult doing work/chores Not difficult at all Not difficult at all     History Ameya has a past medical history of Adenomatous colon polyp (7 /22/ 2010), Allergy, Cancer (HCC), Diverticulosis of colon (11/12/2008), GERD (gastroesophageal reflux disease), Hearing loss, Heart murmur, Hyperlipidemia, Hypertension, Internal hemorrhoid (11/12/2008), and Vitamin D  deficiency.   He has a past surgical history that includes Colonoscopy; Polypectomy; and lump removed from right axilla area.   His family history includes Colon polyps in his brother; Heart disease in his father; Hypertension in his brother, father, and mother.He reports that he has never smoked. He has never used smokeless tobacco. He reports that he does not drink alcohol and does not use drugs.    ROS Review of Systems  Constitutional:  Negative for activity change, fatigue and unexpected weight change.  HENT:  Negative for congestion, ear pain, hearing loss, postnasal drip and trouble swallowing.   Eyes:  Negative for pain and visual disturbance.  Respiratory:  Negative for cough, chest tightness and shortness of breath.   Cardiovascular:  Negative for chest pain, palpitations and leg  swelling.  Gastrointestinal:  Negative for abdominal distention, abdominal pain, blood in stool, constipation, diarrhea, nausea and vomiting.  Endocrine: Negative for cold intolerance, heat intolerance and polydipsia.  Genitourinary:  Positive for frequency. Negative for difficulty urinating, dysuria, flank pain and urgency.  Musculoskeletal:  Negative for arthralgias and joint swelling.  Skin:  Negative for color change, rash and wound.  Neurological:  Negative for dizziness, syncope, speech difficulty, weakness, light-headedness, numbness and headaches.  Hematological:  Does not bruise/bleed easily.  Psychiatric/Behavioral:  Negative for confusion, decreased concentration, dysphoric mood and sleep disturbance. The patient is not nervous/anxious.     Objective:  BP (!) 117/57   Pulse 87   Temp (!) 97.5 F (36.4 C) (Temporal)   Ht 5\' 7"  (1.702 m)   Wt 167 lb 9.6 oz (76 kg)   SpO2 97%   BMI 26.25 kg/m   BP Readings from Last 3 Encounters:  09/19/23 (!) 117/57  06/20/23 114/65  05/02/23 125/63    Wt Readings from Last 3 Encounters:  09/19/23 167 lb 9.6 oz (76 kg)  05/02/23 165 lb (74.8 kg)  07/17/22 164 lb (74.4 kg)     Physical Exam Constitutional:      Appearance: He is well-developed.  HENT:     Head: Normocephalic and atraumatic.  Eyes:     Pupils: Pupils are equal, round, and reactive to light.  Neck:     Thyroid: No thyromegaly.  Trachea: No tracheal deviation.  Cardiovascular:     Rate and Rhythm: Normal rate and regular rhythm.     Pulses: Normal pulses.     Heart sounds: Murmur heard.     No friction rub. No gallop.  Pulmonary:     Breath sounds: Normal breath sounds. No wheezing or rales.  Abdominal:     General: Bowel sounds are normal. There is no distension.     Palpations: Abdomen is soft. There is no mass.     Tenderness: There is no abdominal tenderness.     Hernia: There is no hernia in the left inguinal area.  Genitourinary:    Penis:  Normal.      Testes: Normal.  Musculoskeletal:        General: Normal range of motion.     Cervical back: Normal range of motion.  Lymphadenopathy:     Cervical: No cervical adenopathy.  Skin:    General: Skin is warm and dry.  Neurological:     Mental Status: He is alert and oriented to person, place, and time.      Assessment & Plan:  Annual physical exam -     Urinalysis, Complete -     CBC with Differential/Platelet -     CMP14+EGFR  Mixed hyperlipidemia -     Lipid panel  Primary hypertension -     Urinalysis, Complete -     Rubeola antibody IgG -     CBC with Differential/Platelet -     CMP14+EGFR -     Lipid panel  Statin intolerance  Hearing loss of right ear, unspecified hearing loss type -     Ambulatory referral to ENT  Elevated PSA -     PSA, total and free  Other orders -     oxyBUTYnin Chloride ER; Take 1 tablet (10 mg total) by mouth at bedtime.  Dispense: 30 tablet; Refill: 2     Follow-up: No follow-ups on file.  Roise Cleaver, M.D.

## 2023-09-20 LAB — CMP14+EGFR
ALT: 22 IU/L (ref 0–44)
AST: 22 IU/L (ref 0–40)
Albumin: 4.5 g/dL (ref 3.9–4.9)
Alkaline Phosphatase: 103 IU/L (ref 44–121)
BUN/Creatinine Ratio: 12 (ref 10–24)
BUN: 13 mg/dL (ref 8–27)
Bilirubin Total: 1.3 mg/dL — ABNORMAL HIGH (ref 0.0–1.2)
CO2: 23 mmol/L (ref 20–29)
Calcium: 9.7 mg/dL (ref 8.6–10.2)
Chloride: 102 mmol/L (ref 96–106)
Creatinine, Ser: 1.1 mg/dL (ref 0.76–1.27)
Globulin, Total: 2.5 g/dL (ref 1.5–4.5)
Glucose: 108 mg/dL — ABNORMAL HIGH (ref 70–99)
Potassium: 4 mmol/L (ref 3.5–5.2)
Sodium: 140 mmol/L (ref 134–144)
Total Protein: 7 g/dL (ref 6.0–8.5)
eGFR: 72 mL/min/{1.73_m2} (ref >59)

## 2023-09-20 LAB — LIPID PANEL
Cholesterol, Total: 150 mg/dL (ref 100–199)
HDL: 50 mg/dL (ref >39)
LDL CALC COMMENT:: 3 ratio (ref 0.0–5.0)
LDL Chol Calc (NIH): 86 mg/dL (ref 0–99)
Triglycerides: 68 mg/dL (ref 0–149)
VLDL Cholesterol Cal: 14 mg/dL (ref 5–40)

## 2023-09-20 LAB — CBC WITH DIFFERENTIAL/PLATELET
Basophils Absolute: 0.1 10*3/uL (ref 0.0–0.2)
Basos: 1 %
EOS (ABSOLUTE): 0.2 10*3/uL (ref 0.0–0.4)
Eos: 3 %
Hematocrit: 46.6 % (ref 37.5–51.0)
Hemoglobin: 15.4 g/dL (ref 13.0–17.7)
Immature Grans (Abs): 0 10*3/uL (ref 0.0–0.1)
Immature Granulocytes: 1 %
Lymphocytes Absolute: 2 10*3/uL (ref 0.7–3.1)
Lymphs: 30 %
MCH: 29.5 pg (ref 26.6–33.0)
MCHC: 33 g/dL (ref 31.5–35.7)
MCV: 89 fL (ref 79–97)
Monocytes Absolute: 0.7 10*3/uL (ref 0.1–0.9)
Monocytes: 11 %
Neutrophils Absolute: 3.5 10*3/uL (ref 1.4–7.0)
Neutrophils: 54 %
Platelets: 192 10*3/uL (ref 150–450)
RBC: 5.22 x10E6/uL (ref 4.14–5.80)
RDW: 12.6 % (ref 11.6–15.4)
WBC: 6.4 10*3/uL (ref 3.4–10.8)

## 2023-09-20 LAB — RUBEOLA ANTIBODY IGG

## 2023-09-20 LAB — PSA, TOTAL AND FREE
PSA, Free Pct: 36.8 %
PSA, Free: 1.25 ng/mL
Prostate Specific Ag, Serum: 3.4 ng/mL (ref 0.0–4.0)

## 2023-09-21 ENCOUNTER — Ambulatory Visit: Payer: Self-pay | Admitting: Family Medicine

## 2023-10-25 ENCOUNTER — Encounter (INDEPENDENT_AMBULATORY_CARE_PROVIDER_SITE_OTHER): Payer: Self-pay

## 2023-11-07 ENCOUNTER — Ambulatory Visit

## 2023-11-07 VITALS — BP 117/57 | HR 87 | Ht 67.0 in | Wt 167.0 lb

## 2023-11-07 DIAGNOSIS — Z0000 Encounter for general adult medical examination without abnormal findings: Secondary | ICD-10-CM

## 2023-11-07 NOTE — Progress Notes (Signed)
 Subjective:   Cesar West is a 68 y.o. who presents for a Medicare Wellness preventive visit.  As a reminder, Annual Wellness Visits don't include a physical exam, and some assessments may be limited, especially if this visit is performed virtually. We may recommend an in-person follow-up visit with your provider if needed.  Visit Complete: Virtual I connected with  Cesar West on 11/07/23 by a audio enabled telemedicine application and verified that I am speaking with the correct person using two identifiers.  Patient Location: Home  Provider Location: Home Office  I discussed the limitations of evaluation and management by telemedicine. The patient expressed understanding and agreed to proceed.  Vital Signs: Because this visit was a virtual/telehealth visit, some criteria may be missing or patient reported. Any vitals not documented were not able to be obtained and vitals that have been documented are patient reported.  VideoDeclined- This patient declined Librarian, academic. Therefore the visit was completed with audio only.  Persons Participating in Visit: Patient.  AWV Questionnaire: No: Patient Medicare AWV questionnaire was not completed prior to this visit.  Cardiac Risk Factors include: advanced age (>33men, >26 women);dyslipidemia;hypertension;male gender     Objective:    Today's Vitals   11/07/23 1342  BP: (!) 117/57  Pulse: 87  Weight: 167 lb (75.8 kg)  Height: 5' 7 (1.702 m)   Body mass index is 26.16 kg/m.     11/07/2023    1:45 PM 07/17/2022   11:12 AM 04/04/2021    2:56 PM  Advanced Directives  Does Patient Have a Medical Advance Directive? No No Yes  Type of Advance Directive   Living will  Would patient like information on creating a medical advance directive?  No - Patient declined     Current Medications (verified) Outpatient Encounter Medications as of 11/07/2023  Medication Sig   amLODipine  (NORVASC ) 5 MG tablet  Take 1 tablet (5 mg total) by mouth daily.   b complex vitamins capsule Take 1 capsule by mouth daily.   cholecalciferol (VITAMIN D ) 1000 UNITS tablet Take 2,000 Units by mouth daily.   fish oil-omega-3 fatty acids  1000 MG capsule Take 2 capsules (2 g total) by mouth daily.   losartan -hydrochlorothiazide  (HYZAAR) 100-25 MG tablet Take 1 tablet by mouth daily.   oxybutynin  (DITROPAN -XL) 10 MG 24 hr tablet Take 1 tablet (10 mg total) by mouth at bedtime.   No facility-administered encounter medications on file as of 11/07/2023.    Allergies (verified) Statins   History: Past Medical History:  Diagnosis Date   Adenomatous colon polyp 7 /22/ 2010   Allergy    mild   Cancer (HCC)    basal cell skin of the right triceps area   Diverticulosis of colon 11/12/2008   left colon   GERD (gastroesophageal reflux disease)    some if eats  late    Hearing loss    right ear   Heart murmur    Hyperlipidemia    Hypertension    Internal hemorrhoid 11/12/2008   Vitamin D  deficiency    Past Surgical History:  Procedure Laterality Date   COLONOSCOPY     lump removed from right axilla area     POLYPECTOMY     Family History  Problem Relation Age of Onset   Hypertension Mother    Hypertension Father    Heart disease Father    Hypertension Brother    Colon polyps Brother    Colon cancer Neg Hx  Esophageal cancer Neg Hx    Rectal cancer Neg Hx    Stomach cancer Neg Hx    Social History   Socioeconomic History   Marital status: Single    Spouse name: Not on file   Number of children: Not on file   Years of education: Not on file   Highest education level: Not on file  Occupational History   Not on file  Tobacco Use   Smoking status: Never   Smokeless tobacco: Never  Substance and Sexual Activity   Alcohol use: No   Drug use: No   Sexual activity: Not on file  Other Topics Concern   Not on file  Social History Narrative   Not on file   Social Drivers of Health    Financial Resource Strain: Low Risk  (11/07/2023)   Overall Financial Resource Strain (CARDIA)    Difficulty of Paying Living Expenses: Not hard at all  Food Insecurity: No Food Insecurity (11/07/2023)   Hunger Vital Sign    Worried About Running Out of Food in the Last Year: Never true    Ran Out of Food in the Last Year: Never true  Transportation Needs: No Transportation Needs (11/07/2023)   PRAPARE - Administrator, Civil Service (Medical): No    Lack of Transportation (Non-Medical): No  Physical Activity: Sufficiently Active (11/07/2023)   Exercise Vital Sign    Days of Exercise per Week: 7 days    Minutes of Exercise per Session: 60 min  Stress: No Stress Concern Present (11/07/2023)   [employer] of Occupational Health - Occupational Stress Questionnaire    Feeling of Stress: Not at all  Social Connections: Socially Isolated (11/07/2023)   Social Connection and Isolation Panel    Frequency of Communication with Friends and Family: More than three times a week    Frequency of Social Gatherings with Friends and Family: More than three times a week    Attends Religious Services: Never    Database administrator or Organizations: No    Attends Engineer, structural: Never    Marital Status: Divorced    Tobacco Counseling Counseling given: Yes    Clinical Intake:  Pre-visit preparation completed: Yes  Pain : No/denies pain     BMI - recorded: 26.16 Nutritional Status: BMI 25 -29 Overweight Nutritional Risks: None Diabetes: No  No results found for: HGBA1C   How often do you need to have someone help you when you read instructions, pamphlets, or other written materials from your doctor or pharmacy?: 1 - Never  Interpreter Needed?: No  Information entered by :: alia t/cma   Activities of Daily Living     11/07/2023    1:45 PM  In your present state of health, do you have any difficulty performing the following activities:  Hearing? 1   Vision? 0  Difficulty concentrating or making decisions? 0  Walking or climbing stairs? 0  Dressing or bathing? 0  Doing errands, shopping? 0  Preparing Food and eating ? N  Using the Toilet? N  In the past six months, have you accidently leaked urine? N  Do you have problems with loss of bowel control? N  Managing your Medications? N  Managing your Finances? N  Housekeeping or managing your Housekeeping? N    Patient Care Team: Zollie Lowers, MD as PCP - General (Family Medicine)  I have updated your Care Teams any recent Medical Services you may have received from other providers in  the past year.     Assessment:   This is a routine wellness examination for Cesar West.  Hearing/Vision screen Hearing Screening - Comments:: Pt done hear good out of r-ear Vision Screening - Comments:: Pt wear glasses denies vision dif/pt goes to [facility] in [place]/last ov 4/25   Goals Addressed             This Visit's Progress    Exercise 3x per week (30 min per time)   On track    Increase exercise.        Depression Screen     11/07/2023    1:48 PM 09/19/2023   10:38 AM 06/20/2023   10:52 AM 05/02/2023   10:30 AM 07/17/2022   11:11 AM 04/13/2022   10:36 AM 04/04/2021    2:51 PM  PHQ 2/9 Scores  PHQ - 2 Score 0 0 1 0 0 0 0  PHQ- 9 Score 1 0 4 3   0    Fall Risk     11/07/2023    1:43 PM 09/19/2023   10:38 AM 07/17/2022   11:10 AM 04/13/2022   10:36 AM 04/04/2021    2:57 PM  Fall Risk   Falls in the past year? 0 0 0 0 0  Number falls in past yr: 0 0 0  0  Injury with Fall? 0 0 0  0  Risk for fall due to : No Fall Risks No Fall Risks No Fall Risks  Impaired vision  Follow up Falls evaluation completed Falls evaluation completed Falls prevention discussed;Education provided;Falls evaluation completed  Falls prevention discussed      Data saved with a previous flowsheet row definition    MEDICARE RISK AT HOME:  Medicare Risk at Home Any stairs in or around the home?:  Yes If so, are there any without handrails?: Yes Home free of loose throw rugs in walkways, pet beds, electrical cords, etc?: Yes Adequate lighting in your home to reduce risk of falls?: Yes Life alert?: No Use of a cane, walker or w/c?: No Grab bars in the bathroom?: Yes Shower chair or bench in shower?: No Elevated toilet seat or a handicapped toilet?: Yes  TIMED UP AND GO:  Was the test performed?  no  Cognitive Function: 6CIT completed        11/07/2023    1:49 PM 07/17/2022   11:13 AM 04/04/2021    3:01 PM  6CIT Screen  What Year? 0 points 0 points 0 points  What month? 0 points 0 points 0 points  What time? 0 points 0 points 0 points  Count back from 20 0 points 0 points 0 points  Months in reverse 0 points 0 points 0 points  Repeat phrase 0 points 0 points 2 points  Total Score 0 points 0 points 2 points    Immunizations Immunization History  Administered Date(s) Administered   Fluad Quad(high Dose 65+) 02/06/2019   Influenza Split 01/16/2013   Influenza Whole 04/04/2012   Influenza, High Dose Seasonal PF 01/16/2020, 12/31/2020   Influenza, Seasonal, Injecte, Preservative Fre 01/05/2014, 02/05/2023   Influenza,inj,Quad PF,6+ Mos 02/08/2015, 02/04/2018, 12/15/2021   Influenza-Unspecified 01/17/2016, 03/01/2017   Janssen (J&J) SARS-COV-2 Vaccination 10/03/2019   Moderna Sars-Covid-2 Vaccination 04/30/2020   Pneumococcal Conjugate-13 05/11/2014   Pneumococcal Polysaccharide-23 09/23/2018   Td 04/09/2007, 03/06/2011   Tdap 11/14/2017   Zoster Recombinant(Shingrix) 06/06/2018, 06/06/2018, 10/31/2018   Zoster, Live 07/20/2014    Screening Tests Health Maintenance  Topic Date Due   COVID-19 Vaccine (  3 - 2024-25 season) 11/22/2024 (Originally 12/24/2022)   INFLUENZA VACCINE  11/23/2023   Colonoscopy  03/25/2024   Medicare Annual Wellness (AWV)  11/06/2024   DTaP/Tdap/Td (4 - Td or Tdap) 11/15/2027   Pneumococcal Vaccine: 50+ Years  Completed   Hepatitis C  Screening  Completed   Zoster Vaccines- Shingrix  Completed   Hepatitis B Vaccines  Aged Out   HPV VACCINES  Aged Out   Meningococcal B Vaccine  Aged Out    Health Maintenance  There are no preventive care reminders to display for this patient.  Health Maintenance Items Addressed: See Nurse Notes at the end of this note  Additional Screening:  Vision Screening: Recommended annual ophthalmology exams for early detection of glaucoma and other disorders of the eye. Would you like a referral to an eye doctor? No    Dental Screening: Recommended annual dental exams for proper oral hygiene  Community Resource Referral / Chronic Care Management: CRR required this visit?  No   CCM required this visit?  No   Plan:    I have personally reviewed and noted the following in the patient's chart:   Medical and social history Use of alcohol, tobacco or illicit drugs  Current medications and supplements including opioid prescriptions. Patient is not currently taking opioid prescriptions. Functional ability and status Nutritional status Physical activity Advanced directives List of other physicians Hospitalizations, surgeries, and ER visits in previous 12 months Vitals Screenings to include cognitive, depression, and falls Referrals and appointments  In addition, I have reviewed and discussed with patient certain preventive protocols, quality metrics, and best practice recommendations. A written personalized care plan for preventive services as well as general preventive health recommendations were provided to patient.   Ozie Ned, CMA   11/07/2023   After Visit Summary: (MyChart) Due to this being a telephonic visit, the after visit summary with patients personalized plan was offered to patient via MyChart   Notes: Nothing significant to report at this time.

## 2023-11-07 NOTE — Patient Instructions (Signed)
 Mr. Cesar West , Thank you for taking time out of your busy schedule to complete your Annual Wellness Visit with me. I enjoyed our conversation and look forward to speaking with you again next year. I, as well as your care team,  appreciate your ongoing commitment to your health goals. Please review the following plan we discussed and let me know if I can assist you in the future. Your Game plan/ To Do List    Follow up Visits: Next Medicare AWV with our clinical staff: 11/07/24 at 10:40a.m    Next Office Visit with your provider: 09/22/24 at 8:55a.m.  Clinician Recommendations:  Aim for 30 minutes of exercise or brisk walking, 6-8 glasses of water, and 5 servings of fruits and vegetables each day.       This is a list of the screening recommended for you and due dates:  Health Maintenance  Topic Date Due   Medicare Annual Wellness Visit  07/17/2023   COVID-19 Vaccine (3 - 2024-25 season) 11/22/2024*   Flu Shot  11/23/2023   Colon Cancer Screening  03/25/2024   DTaP/Tdap/Td vaccine (4 - Td or Tdap) 11/15/2027   Pneumococcal Vaccine for age over 67  Completed   Hepatitis C Screening  Completed   Zoster (Shingles) Vaccine  Completed   Hepatitis B Vaccine  Aged Out   HPV Vaccine  Aged Out   Meningitis B Vaccine  Aged Out  *Topic was postponed. The date shown is not the original due date.    Advanced directives: (Declined) Advance directive discussed with you today. Even though you declined this today, please call our office should you change your mind, and we can give you the proper paperwork for you to fill out. Advance Care Planning is important because it:  [x]  Makes sure you receive the medical care that is consistent with your values, goals, and preferences  [x]  It provides guidance to your family and loved ones and reduces their decisional burden about whether or not they are making the right decisions based on your wishes.  Follow the link provided in your after visit summary or read  over the paperwork we have mailed to you to help you started getting your Advance Directives in place. If you need assistance in completing these, please reach out to us  so that we can help you!  See attachments for Preventive Care and Fall Prevention Tips.

## 2023-12-14 ENCOUNTER — Other Ambulatory Visit: Payer: Self-pay | Admitting: Family Medicine

## 2024-09-22 ENCOUNTER — Encounter: Payer: Self-pay | Admitting: Family Medicine

## 2024-11-07 ENCOUNTER — Ambulatory Visit: Payer: Self-pay
